# Patient Record
Sex: Male | Born: 1935
Health system: Southern US, Community
[De-identification: ages and names within clinical notes are randomized; demographics above are authoritative.]

## PROBLEM LIST (undated history)

## (undated) DIAGNOSIS — T4145XA Adverse effect of unspecified anesthetic, initial encounter: Secondary | ICD-10-CM

## (undated) DIAGNOSIS — G479 Sleep disorder, unspecified: Secondary | ICD-10-CM

## (undated) DIAGNOSIS — M1990 Unspecified osteoarthritis, unspecified site: Secondary | ICD-10-CM

## (undated) DIAGNOSIS — I10 Essential (primary) hypertension: Secondary | ICD-10-CM

## (undated) DIAGNOSIS — E785 Hyperlipidemia, unspecified: Secondary | ICD-10-CM

## (undated) DIAGNOSIS — K5790 Diverticulosis of intestine, part unspecified, without perforation or abscess without bleeding: Secondary | ICD-10-CM

## (undated) DIAGNOSIS — K219 Gastro-esophageal reflux disease without esophagitis: Secondary | ICD-10-CM

## (undated) DIAGNOSIS — K449 Diaphragmatic hernia without obstruction or gangrene: Secondary | ICD-10-CM

## (undated) DIAGNOSIS — T8859XA Other complications of anesthesia, initial encounter: Secondary | ICD-10-CM

## (undated) DIAGNOSIS — K2270 Barrett's esophagus without dysplasia: Secondary | ICD-10-CM

## (undated) DIAGNOSIS — N400 Enlarged prostate without lower urinary tract symptoms: Secondary | ICD-10-CM

## (undated) DIAGNOSIS — D649 Anemia, unspecified: Secondary | ICD-10-CM

## (undated) DIAGNOSIS — M459 Ankylosing spondylitis of unspecified sites in spine: Secondary | ICD-10-CM

## (undated) DIAGNOSIS — S0031XA Abrasion of nose, initial encounter: Secondary | ICD-10-CM

## (undated) HISTORY — PX: UMBILICAL HERNIA REPAIR: SHX196

## (undated) HISTORY — DX: Gastro-esophageal reflux disease without esophagitis: K21.9

## (undated) HISTORY — DX: Sleep disorder, unspecified: G47.9

## (undated) HISTORY — DX: Hyperlipidemia, unspecified: E78.5

## (undated) HISTORY — PX: JOINT REPLACEMENT: SHX530

## (undated) HISTORY — PX: OTHER SURGICAL HISTORY: SHX169

## (undated) HISTORY — PX: TOTAL HIP ARTHROPLASTY: SHX124

## (undated) HISTORY — DX: Benign prostatic hyperplasia without lower urinary tract symptoms: N40.0

## (undated) HISTORY — DX: Diaphragmatic hernia without obstruction or gangrene: K44.9

## (undated) HISTORY — DX: Diverticulosis of intestine, part unspecified, without perforation or abscess without bleeding: K57.90

## (undated) HISTORY — PX: TOE SURGERY: SHX1073

## (undated) HISTORY — DX: Anemia, unspecified: D64.9

## (undated) HISTORY — DX: Essential (primary) hypertension: I10

## (undated) HISTORY — DX: Barrett's esophagus without dysplasia: K22.70

## (undated) HISTORY — DX: Unspecified osteoarthritis, unspecified site: M19.90

---

## 1972-12-08 DIAGNOSIS — M1990 Unspecified osteoarthritis, unspecified site: Secondary | ICD-10-CM

## 1972-12-08 HISTORY — DX: Unspecified osteoarthritis, unspecified site: M19.90

## 1995-12-09 HISTORY — PX: INGUINAL HERNIA REPAIR: SUR1180

## 1998-05-30 ENCOUNTER — Emergency Department (HOSPITAL_COMMUNITY): Admission: EM | Admit: 1998-05-30 | Discharge: 1998-05-31 | Payer: Self-pay | Admitting: Internal Medicine

## 1998-08-02 ENCOUNTER — Inpatient Hospital Stay (HOSPITAL_COMMUNITY): Admission: RE | Admit: 1998-08-02 | Discharge: 1998-08-08 | Payer: Self-pay | Admitting: Orthopedic Surgery

## 1998-08-09 ENCOUNTER — Encounter (HOSPITAL_COMMUNITY): Admission: RE | Admit: 1998-08-09 | Discharge: 1998-11-07 | Payer: Self-pay | Admitting: Orthopedic Surgery

## 1999-01-15 ENCOUNTER — Encounter: Admission: RE | Admit: 1999-01-15 | Discharge: 1999-03-29 | Payer: Self-pay | Admitting: Rheumatology

## 1999-01-21 ENCOUNTER — Other Ambulatory Visit: Admission: RE | Admit: 1999-01-21 | Discharge: 1999-01-21 | Payer: Self-pay | Admitting: Gastroenterology

## 1999-10-09 ENCOUNTER — Encounter: Payer: Self-pay | Admitting: Orthopedic Surgery

## 1999-10-15 ENCOUNTER — Ambulatory Visit (HOSPITAL_COMMUNITY): Admission: RE | Admit: 1999-10-15 | Discharge: 1999-10-15 | Payer: Self-pay | Admitting: Orthopedic Surgery

## 2001-01-06 ENCOUNTER — Encounter (INDEPENDENT_AMBULATORY_CARE_PROVIDER_SITE_OTHER): Payer: Self-pay | Admitting: Specialist

## 2001-01-06 ENCOUNTER — Other Ambulatory Visit: Admission: RE | Admit: 2001-01-06 | Discharge: 2001-01-06 | Payer: Self-pay | Admitting: Gastroenterology

## 2001-07-12 ENCOUNTER — Other Ambulatory Visit: Admission: RE | Admit: 2001-07-12 | Discharge: 2001-07-12 | Payer: Self-pay | Admitting: Gastroenterology

## 2001-07-12 ENCOUNTER — Encounter (INDEPENDENT_AMBULATORY_CARE_PROVIDER_SITE_OTHER): Payer: Self-pay | Admitting: Specialist

## 2002-12-08 DIAGNOSIS — K2270 Barrett's esophagus without dysplasia: Secondary | ICD-10-CM

## 2002-12-08 HISTORY — DX: Barrett's esophagus without dysplasia: K22.70

## 2006-01-16 ENCOUNTER — Ambulatory Visit: Payer: Self-pay | Admitting: Gastroenterology

## 2006-02-18 ENCOUNTER — Ambulatory Visit: Payer: Self-pay | Admitting: Gastroenterology

## 2006-02-18 ENCOUNTER — Encounter (INDEPENDENT_AMBULATORY_CARE_PROVIDER_SITE_OTHER): Payer: Self-pay | Admitting: *Deleted

## 2006-10-01 ENCOUNTER — Ambulatory Visit (HOSPITAL_COMMUNITY): Admission: RE | Admit: 2006-10-01 | Discharge: 2006-10-01 | Payer: Self-pay | Admitting: General Surgery

## 2007-04-23 ENCOUNTER — Ambulatory Visit (HOSPITAL_COMMUNITY): Admission: RE | Admit: 2007-04-23 | Discharge: 2007-04-23 | Payer: Self-pay | Admitting: Orthopedic Surgery

## 2007-04-29 ENCOUNTER — Encounter: Admission: RE | Admit: 2007-04-29 | Discharge: 2007-06-18 | Payer: Self-pay | Admitting: Orthopedic Surgery

## 2008-07-03 ENCOUNTER — Observation Stay (HOSPITAL_COMMUNITY): Admission: EM | Admit: 2008-07-03 | Discharge: 2008-07-03 | Payer: Self-pay | Admitting: Emergency Medicine

## 2008-08-01 ENCOUNTER — Encounter: Admission: RE | Admit: 2008-08-01 | Discharge: 2008-08-24 | Payer: Self-pay | Admitting: Orthopedic Surgery

## 2008-10-10 ENCOUNTER — Observation Stay (HOSPITAL_COMMUNITY): Admission: EM | Admit: 2008-10-10 | Discharge: 2008-10-11 | Payer: Self-pay | Admitting: Emergency Medicine

## 2008-11-17 ENCOUNTER — Inpatient Hospital Stay (HOSPITAL_COMMUNITY): Admission: RE | Admit: 2008-11-17 | Discharge: 2008-11-20 | Payer: Self-pay | Admitting: Orthopedic Surgery

## 2008-12-13 ENCOUNTER — Encounter: Admission: RE | Admit: 2008-12-13 | Discharge: 2009-01-05 | Payer: Self-pay | Admitting: Orthopedic Surgery

## 2009-01-22 ENCOUNTER — Inpatient Hospital Stay (HOSPITAL_COMMUNITY): Admission: EM | Admit: 2009-01-22 | Discharge: 2009-01-27 | Payer: Self-pay | Admitting: Emergency Medicine

## 2009-01-31 DIAGNOSIS — D649 Anemia, unspecified: Secondary | ICD-10-CM

## 2009-01-31 DIAGNOSIS — M1990 Unspecified osteoarthritis, unspecified site: Secondary | ICD-10-CM | POA: Insufficient documentation

## 2009-01-31 DIAGNOSIS — F329 Major depressive disorder, single episode, unspecified: Secondary | ICD-10-CM

## 2009-01-31 DIAGNOSIS — K449 Diaphragmatic hernia without obstruction or gangrene: Secondary | ICD-10-CM | POA: Insufficient documentation

## 2009-01-31 DIAGNOSIS — M459 Ankylosing spondylitis of unspecified sites in spine: Secondary | ICD-10-CM | POA: Insufficient documentation

## 2009-01-31 DIAGNOSIS — I10 Essential (primary) hypertension: Secondary | ICD-10-CM

## 2009-01-31 DIAGNOSIS — K2270 Barrett's esophagus without dysplasia: Secondary | ICD-10-CM

## 2009-01-31 DIAGNOSIS — F3289 Other specified depressive episodes: Secondary | ICD-10-CM | POA: Insufficient documentation

## 2009-01-31 DIAGNOSIS — K5730 Diverticulosis of large intestine without perforation or abscess without bleeding: Secondary | ICD-10-CM | POA: Insufficient documentation

## 2009-01-31 DIAGNOSIS — K219 Gastro-esophageal reflux disease without esophagitis: Secondary | ICD-10-CM | POA: Insufficient documentation

## 2009-01-31 DIAGNOSIS — N400 Enlarged prostate without lower urinary tract symptoms: Secondary | ICD-10-CM | POA: Insufficient documentation

## 2009-02-06 ENCOUNTER — Ambulatory Visit: Payer: Self-pay | Admitting: Gastroenterology

## 2009-02-07 ENCOUNTER — Telehealth (INDEPENDENT_AMBULATORY_CARE_PROVIDER_SITE_OTHER): Payer: Self-pay | Admitting: *Deleted

## 2009-02-21 ENCOUNTER — Encounter: Admission: RE | Admit: 2009-02-21 | Discharge: 2009-05-22 | Payer: Self-pay | Admitting: Orthopedic Surgery

## 2009-03-06 ENCOUNTER — Ambulatory Visit: Payer: Self-pay | Admitting: Gastroenterology

## 2009-03-06 LAB — CONVERTED CEMR LAB
AST: 29 units/L (ref 0–37)
Albumin: 3.8 g/dL (ref 3.5–5.2)
Alkaline Phosphatase: 68 units/L (ref 39–117)
Basophils Absolute: 0.1 10*3/uL (ref 0.0–0.1)
Bilirubin, Direct: 0.2 mg/dL (ref 0.0–0.3)
Calcium: 9.2 mg/dL (ref 8.4–10.5)
Eosinophils Absolute: 0.3 10*3/uL (ref 0.0–0.7)
Ferritin: 27.5 ng/mL (ref 22.0–322.0)
Folate: >20 ng/mL
GFR calc non Af Amer: 87.86 mL/min (ref >60)
HCT: 42.9 % (ref 39.0–52.0)
Iron: 74 ug/dL (ref 42–165)
Lymphs Abs: 0.9 10*3/uL (ref 0.7–4.0)
MCV: 93.6 fL (ref 78.0–100.0)
Monocytes Absolute: 1.2 10*3/uL — ABNORMAL HIGH (ref 0.1–1.0)
Neutrophils Relative %: 64.9 % (ref 43.0–77.0)
Platelets: 246 10*3/uL (ref 150.0–400.0)
Potassium: 4.1 meq/L (ref 3.5–5.1)
RDW: 12.3 % (ref 11.5–14.6)
Saturation Ratios: 17.7 % — ABNORMAL LOW (ref 20.0–50.0)
Sodium: 141 meq/L (ref 135–145)
Total Bilirubin: 0.6 mg/dL (ref 0.3–1.2)
Transferrin: 298.2 mg/dL (ref 212.0–360.0)
WBC: 6.8 10*3/uL (ref 4.5–10.5)

## 2009-03-16 ENCOUNTER — Telehealth: Payer: Self-pay | Admitting: Gastroenterology

## 2009-03-19 ENCOUNTER — Ambulatory Visit: Payer: Self-pay | Admitting: Gastroenterology

## 2009-06-14 ENCOUNTER — Telehealth: Payer: Self-pay | Admitting: Gastroenterology

## 2009-06-19 ENCOUNTER — Telehealth: Payer: Self-pay | Admitting: Gastroenterology

## 2009-07-10 ENCOUNTER — Ambulatory Visit: Payer: Self-pay | Admitting: Gastroenterology

## 2009-07-25 ENCOUNTER — Ambulatory Visit: Payer: Self-pay | Admitting: Gastroenterology

## 2011-03-25 LAB — CBC
HCT: 43.4 % (ref 39.0–52.0)
Hemoglobin: 13 g/dL (ref 13.0–17.0)
Hemoglobin: 14.6 g/dL (ref 13.0–17.0)
MCHC: 33.5 g/dL (ref 30.0–36.0)
MCHC: 33.6 g/dL (ref 30.0–36.0)
MCHC: 34 g/dL (ref 30.0–36.0)
MCHC: 34.6 g/dL (ref 30.0–36.0)
MCV: 96.1 fL (ref 78.0–100.0)
MCV: 96.6 fL (ref 78.0–100.0)
Platelets: 180 10*3/uL (ref 150–400)
Platelets: 202 10*3/uL (ref 150–400)
RDW: 12.6 % (ref 11.5–15.5)
RDW: 12.9 % (ref 11.5–15.5)
WBC: 11.8 10*3/uL — ABNORMAL HIGH (ref 4.0–10.5)

## 2011-03-25 LAB — BASIC METABOLIC PANEL
BUN: 12 mg/dL (ref 6–23)
BUN: 12 mg/dL (ref 6–23)
CO2: 33 mEq/L — ABNORMAL HIGH (ref 19–32)
CO2: 33 mEq/L — ABNORMAL HIGH (ref 19–32)
Calcium: 8.3 mg/dL — ABNORMAL LOW (ref 8.4–10.5)
Chloride: 102 mEq/L (ref 96–112)
Chloride: 99 mEq/L (ref 96–112)
Creatinine, Ser: 0.95 mg/dL (ref 0.4–1.5)
Creatinine, Ser: 0.98 mg/dL (ref 0.4–1.5)
Glucose, Bld: 105 mg/dL — ABNORMAL HIGH (ref 70–99)
Glucose, Bld: 114 mg/dL — ABNORMAL HIGH (ref 70–99)
Potassium: 4.2 mEq/L (ref 3.5–5.1)
Sodium: 138 mEq/L (ref 135–145)

## 2011-03-25 LAB — PROTIME-INR
INR: 1.1 (ref 0.00–1.49)
INR: 2.5 — ABNORMAL HIGH (ref 0.00–1.49)
Prothrombin Time: 14.2 seconds (ref 11.6–15.2)
Prothrombin Time: 22.8 seconds — ABNORMAL HIGH (ref 11.6–15.2)
Prothrombin Time: 28.3 seconds — ABNORMAL HIGH (ref 11.6–15.2)

## 2011-03-25 LAB — TYPE AND SCREEN: Antibody Screen: NEGATIVE

## 2011-04-22 NOTE — Op Note (Signed)
NAMETAHA, DIMOND                  ACCOUNT NO.:  [id number]   MEDICAL RECORD NO.:  [id number]          PATIENT TYPE:  INP   LOCATION:  5011                         FACILITY:  [facility]   PHYSICIAN:  Ollen Gross, M.D.    DATE OF BIRTH:  1934-05-12   DATE OF PROCEDURE:  11/17/2008  DATE OF DISCHARGE:                               OPERATIVE REPORT   PREOPERATIVE DIAGNOSIS:  Unstable left total hip arthroplasty.   POSTOPERATIVE DIAGNOSIS:  Unstable left total hip arthroplasty.   PROCEDURE:  Left acetabular revision.   SURGEON:  Ollen Gross, MD   ASSISTANT:  Alexzandrew L. Julien Girt, PA-C   ANESTHESIA:  General.   ESTIMATED BLOOD LOSS:  100.   DRAIN:  None.   COMPLICATIONS:  None.   CONDITION:  Stable to recovery.   BRIEF CLINICAL NOTE:  Mr. Selner is a 77 year old male, had a left hip  arthroplasty revision performed approximately 10 years ago.  Did well  until this past year when he has had 3 dislocations.  He had no  instability problems prior to that.  He presents now for acetabular  versus total hip revision.   PROCEDURE IN DETAIL:  After successful administration of general  anesthetic, the patient was placed in right lateral decubitus position  with the left side up and held with the hip positioner.  Left lower  extremity was isolated from his perineum with plastic drapes, and  prepped and draped in the usual sterile fashion.  Previous  posterolateral incisions were utilized.  Skin cut with a #10 blade  through subcutaneous tissue to the level of fascia lata, which incised  in line with the skin incision.  The posterior pseudocapsule had been  torn off of the femur with his previous dislocations.  There was a small  amount of lytic debris, which was excised.  The acetabular component  appeared to be in very good position.  I placed through a range of  motion and dislocated at 70 degrees of flexion, 40 degrees of abduction,  and about 30 degrees of internal rotation.  I  removed the femoral head.  The femoral component was in very good position with about 25 degrees of  anteversion.  It is a well-fixed S-ROM component.  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  The trochanteric fragment was rather flimsy  and was really not amenable to reattachment.  It does remain attached  via soft tissue to the main body of the femur, but there was not enough  of bone there to consider reattaching of bone to bone.  The femur was  then retracted anteriorly to gain acetabular exposure.  Scar tissue was  removed from around the acetabular component to show the entire  component.  I then removed the dome screws from the S-ROM acetabular  shell.  The posterior dome screw was easily removed, the anterior one  was lodged inferiorly well, so we just removed the screw head, as we  could not get the screw out.  The 2 dome screws were then removed also  after we got the acetabular  liner out.  The liner had a large groove in  it posteriorly from where it had worn down significantly, and I believe  that groove was a tract that allowed this to dislocate.  The component  was thoroughly inspected and was very well fixed.  It was also in  anatomic position.  I felt that the best method of managing this would  be to up size into a 36-mm head, with a 36-mm liner as apposed to his  previous 28 on 28.  The particular S-ROM shell does not have adequate  polyethylene thickness for a 36.  I put a trial Pinnacle liner 36  neutral +4 and it seated well into the component and would allow for 2-3  mm of cement penetration.  The acetabular shell had holes in it, which  would allow for cement penetration.  I also put grooves into the shell,  so the cement would penetrate into those.  We then opened up the 36 mm  neutral +4 Marathon liner and mixed the cement.  Once it was ready for  implantation, the cement was placed into the acetabular shell and a  liner was impacted into the shell and  held in position until cement  hardens.  This also gave [place] a little more offset than his previous  polyethylene head.  Once it was fully hardened, then I placed a trial  36, +0 head and I felt that it did not have enough of soft tissue  tension, so I went to a 36, +3 which had appropriate soft tissue  tension.  The stability was great with full extension, full external  rotation, 70 degrees of flexion, 40 degrees of adduction, and 90 degrees  of internal rotation and 90 degrees of flexion and 70 degrees of  internal rotation.  By placing the left leg on top of the right, I felt  as though lengths were equal.  When I was placed in through a range of  motion, the acetabular liner remained stable throughout and there was  absolutely no motion between the liner and the shell.  We then  dislocated the hip and placed the permanent 36 mm +3 femoral head for  the S-ROM femur.  We placed through a range of motion, still had the  same stability.  Wounds again irrigated, and the soft tissue closed  posteriorly with #1 Ethibond.  Fascia lata was closed with interrupted  #1 Vicryl, subcu closed with #1 and 2-0 Vicryl, and skin with staples.  Incisions cleaned and dried, and a bulky sterile dressing applied.  He  was then placed into a knee immobilizer, awakened, and transported to  recovery in stable condition.      Ollen Gross, M.D.  Electronically Signed     FA/MEDQ  D:  11/17/2008  T:  11/17/2008  Job:  [id number]

## 2011-04-22 NOTE — Discharge Summary (Signed)
NAMEYANKY, VANDERBURG                  ACCOUNT NO.:  [id number]   MEDICAL RECORD NO.:  [id number]          PATIENT TYPE:  INP   LOCATION:  1611                         [facility]ACILITY:  [facility]   PHYSICIAN:  Ollen Gross, M.D.    DATE OF BIRTH:  [birth year]   DATE OF ADMISSION:  01/22/2009  DATE OF DISCHARGE:  01/27/2009                               DISCHARGE SUMMARY   DISCHARGE DIAGNOSIS:  Unstable left total hip arthroplasty.   OTHER DIAGNOSES:  1. Hypertension.  2. Arthritis.  3. Prostate hypertrophy.  4. Reflux.   PROCEDURES:  1. Attempted closed reduction of dislocated hip on January 22, 2009.  2. Left acetabular liner revision to constrained liner on January 24, 2009.   HOSPITAL COURSE:  Mr. [name] is a 77 year old male who was taken to the  emergency room on January 22, 2009, having dislocated his left total  hip.  He had a history of hip instability and had a procedure done in  December 2009, at which point he had his acetabular liner revised and  his femoral head upsized to a 236 mm head.  He had done very well up  until the date of January 22, 2009, when the hip popped out on him.  He  was evaluated in the emergency room by Dr. Lestine Box and taken to the  operating room, at which point closed reduction was attempted.  The hip  was not able to be reduced.  I also accompanied Dr. Lestine Box in the  operating room and could not get the hip reduced.  The patient was  subsequently admitted to the hospital, and it was decided that he would  need a revision to a constrained system.  This had to be ordered, and  thus, he was not taken to surgery until January 24, 2009.  He was  comfortable on January 22, 2009, and January 23, 2009, in the  hospital.  He was on bedrest.  He had stable labs with a hemoglobin of  14.7, BMET with a sodium of 135, potassium of 3.9, chloride 102, CO2 of  29, BUN 12, creatinine 0.95 and glucose 138.  He was taken to the  operating room on January 24, 2009, at which time he had a constrained  liner placed.  This was a constrained liner for the SROM acetabular  shell.  It was a 32 mm, 0 degree L series.  He tolerated this procedure  well without complication.  Procedures done by Dr. Lequita Halt and assisted  Dr. Avel Peace, PA-C.  Estimated blood loss was 100.  He was taken to  the recovery room postoperatively and then to the orthopedic floor in  stable condition.  He had intact neurovascular function of the left  lower extremity.  Postop day 1, hemoglobin was 14.6 with normal  electrolytes.  He was weightbearing as tolerated on the left lower  extremity.  Postop day 2, he ambulated with physical therapy.  Hemoglobin was this 13.9.  His PCA, IV fluids, IV and Foley were all  discontinued at that time.  He was  discharged home on January 27, 2009,  tolerating a regular diet in stable condition.  Hemoglobin was 13.  He  was able to weight bear as tolerated and ambulate well with physical  therapy.   MEDICATIONS:  He was discharged home on the following medications:  1. Vicodin 1-2 tablets q.4-6 h. as needed for pain.  2. Robaxin 500 mg 1 tablet q.6-8 h. as needed for muscle spasm.  3. Coumadin as per pharmacy protocol.  4. Keflex 500 mg 4 times a day due to swelling in his hand from an IV      site.   OTHER MEDICATIONS AT HOME:  Include the following:  1. Hydrochlorothiazide 25 mg daily.  2. Nexium 40 mg daily.  3. Lopressor 25 mg b.i.d.  4. Flomax 0.4 mg at bedtime.  5. Crestor 10 mg p.o. q.p.m.   DISPOSITION:  The patient was sent home weightbearing as tolerated, left  lower extremity.  He is home with home health PT and home health  nursing.  Follow up in a 1-1/2 weeks at Dr. Deri Fuelling office for removal  of staples.  The patient was in good condition at time of discharge.      Ollen Gross, M.D.  Electronically Signed     FA/MEDQ  D:  03/03/2009  T:  03/03/2009  Job:  [id number]

## 2011-04-22 NOTE — Op Note (Signed)
NAMEBURWELL, BETHEL                  ACCOUNT NO.:  [id number]   MEDICAL RECORD NO.:  [id number]          PATIENT TYPE:  OBV   LOCATION:  1611                         [facility]ACILITY:  [facility]   PHYSICIAN:  Leonides Grills, M.D.     DATE OF BIRTH:  20-Aug-1934   DATE OF PROCEDURE:  01/22/2009  DATE OF DISCHARGE:                               OPERATIVE REPORT   PREOPERATIVE DIAGNOSIS:  Left dislocated total hip arthroplasty.   POSTOPERATIVE DIAGNOSIS:  Left dislocated total hip arthroplasty.   OPERATION:  Closed reduction left dislocated total hip arthroplasty.   ANESTHESIA:  General.   SURGEONS:  1. Leonides Grills, M.D.  2. Ollen Gross, M.D.   ASSISTANT:  Richardean Canal, P.A.   ESTIMATED BLOOD LOSS:  None.   Intraoperative x-ray showed a non-concentric reduction, possible loose  poly versus soft tissue interposition.   DISPOSITION:  Stable to PR.   INDICATIONS:  This is a 77 year old male who underwent a revision on  November 17, 2008, by Dr. Lequita Halt of his left total hip arthroplasty.  He was doing great postoperatively until today he was trying to don a  sock and by his own admission was a little aggressive and dislocated  his left hip.  On x-ray, he had a superior dislocation.  He did not  appear to have a fracture.  He consented for the above procedure.  All  risks, including infection, nerve or vessel injury, possibility that we  would not be able to reduce this iatrogenic fracture and possibility of  future revision were all explained and questions were encouraged and  answered.   DESCRIPTION OF PROCEDURE:  The patient was brought to the operating and  placed in supine position after adequate general anesthesia was  administered.  I then attempted a closed reduction of the hip.  I tried  90/90, and slightly abducted, as well as externally rotated and I could  not get it reduced.  Dr. Lequita Halt tried after myself and between him a  myself, we were able to get a somewhat reduced hip.   It seemed that the  leg lengths were of adequate length and he was now externally rotated.  We got the intraoperative x-ray that showed that he had a non-concentric  reduction and it appeared that the head was over the superior rim of the  acetabulum, but essentially was not concentric.   IMPRESSION:  He was stable to the OR in a knee immobilizer.  After  discussing this with Dr. Lequita Halt, we admitted the patient to Dr.  Lequita Halt, and he is going to do a possible revision versus open reduction  of the left total hip arthroplasty.  We both discussed this with the  family after surgery.  All questions were encouraged and answered.      Leonides Grills, M.D.  Electronically Signed     PB/MEDQ  D:  01/22/2009  T:  01/22/2009  Job:  [id number]

## 2011-04-22 NOTE — Op Note (Signed)
[name], [name]                  ACCOUNT NO.:  [id number]   MEDICAL RECORD NO.:  [id number]          PATIENT TYPE:  INP   LOCATION:  1604                         [facility]ACILITY:  [facility]   PHYSICIAN:  Almedia Balls. Ranell Patrick, M.D. DATE OF BIRTH:  10/02/1934   DATE OF PROCEDURE:  10/10/2008  DATE OF DISCHARGE:  10/11/2008                               OPERATIVE REPORT   PREOPERATIVE DIAGNOSIS:  Left total hip dislocation.   POSTOPERATIVE DIAGNOSIS:   PROCEDURE PERFORMED:  Closed reduction of left total hip replacement.   SURGEON:  Almedia Balls. Ranell Patrick, M.D.   ASSISTANT:  None.   ANESTHESIA:  General anesthesia was used.   FLUID REPLACEMENT:  200 mL crystalloid.   INDICATIONS:  The patient is a 77 year old male who has had a total hip  replacement done by Dr. Lequita Halt.  The patient has had multiple episodes  of instability in the last 2 months.  Today is the third dislocation of  his left total hip.  The patient presents now with a clinically  dislocated total hip with dislocated hip on x-ray with no signs of  fracture.  The patient presents for closed reduction.  Informed consent  obtained.   DESCRIPTION OF PROCEDURE:  After an adequate level of general anesthesia  was achieved, the patient had a flexion internal procedure performed  with counter pressure on the anterior superior iliac spine.  We were  able to successfully close reduce the hip regaining leg length and  having smooth motion in the hip after the reduction clinically.  We then  placed the patient in an abduction orthosis and x-rays demonstrated  concentric hip reduction.  No fractures were noted.  The patient was  taken to the recovery room in stable condition.      Almedia Balls. Ranell Patrick, M.D.  Electronically Signed     SRN/MEDQ  D:  10/10/2008  T:  10/11/2008  Job:  [id number]   cc:   Ollen Gross, M.D.  Fax: [phone]

## 2011-04-22 NOTE — Discharge Summary (Signed)
[name], [name]                  ACCOUNT NO.:  [id number]   MEDICAL RECORD NO.:  [id number]          PATIENT TYPE:  INP   LOCATION:  5011                         FACILITY:  [facility]   PHYSICIAN:  Ollen Gross, M.D.    DATE OF BIRTH:  [birth year]   DATE OF ADMISSION:  11/17/2008  DATE OF DISCHARGE:  11/20/2008                               DISCHARGE SUMMARY   ADMITTING DIAGNOSES:  1. Unstable left total hip.  2. Hypertension.  3. Hypercholesterolemia.  4. Reflux disease.  5. Barrett esophagus.   DISCHARGE DIAGNOSES:  1. Unstable left total hip arthroplasty status post left acetabular      revision.  2. Hypertension.  3. Hypercholesterolemia.  4. Reflux disease.  5. Barrett esophagus.   PROCEDURE:  November 17, 2008, left acetabular revision.   SURGEON:  Ollen Gross, MD   ASSISTANT:  Alexzandrew L. Julien Girt, [facility]   ANESTHESIA:  General.   CONSULTS:  None.   BRIEF HISTORY:  [name] is a 77 year old male with a left hip  arthroplasty revision performed approximately 10 years ago, did well,  recent set 3 dislocations felt to be an instability problems admitted  for revision.   LABORATORY DATA:  Preop CBC; hemoglobin 16.9, hematocrit 50.8, white  cell count 9.5, platelets 252.  Postop hemoglobin 15.3.  Last H&H of  14.9 and 43.3.  PT/PTT on admission 13.4 and 34 respectively, INR 1.0.  Serial protimes followed per Coumadin protocol.  Last PT/INR 28.7 and  2.5.  Chem panel on admission all within normal limits except with  elevated glucose of 151.  Serial BMETS were followed.  Electrolytes  remained within normal limits.  Preop UA negative.  Blood group type O+.   HOSPITAL COURSE:  The patient was admitted to [facility], taken  to the OR, underwent above said procedure without complication.  The  patient tolerated the procedure well, later transferred to recovery room  in Orthopedic floor, started on PCA and p.o. analgesics, doing pretty  well.  On the morning  of day 1, discontinued the PCA and weaned over  p.o. meds, started getting up with therapy, partial weightbearing.  Actually did very well on the morning of day 1 with therapy, walked  about feet 70 feet, tolerating meds.  By day 2, the patient continued to  progress well, walking about 120 feet, dressing were changed and  incision looked good.  No signs of infection.  Arrangements are being  made for a discharge.  I gave Dulcolax for constipation.  By day 3, he  had a little bit of temperature on the evening of day 2 and on the  morning of day 3 of 101.4, but temperature was back down to 98.2,  recommended incentive spirometer.  Hemoglobin looked good, tolerating  therapy.  Arrangements were made for home care and the patient was  discharged to home.   DISCHARGE PLAN:  1. The patient was discharged to home on November 20, 2008.  2. Discharge diagnoses, please see above.  3. Discharge meds:  Vicodin, Robaxin, and Coumadin.   ACTIVITY:  Partial weightbearing,  hip precautions, total protocol.   FOLLOWUP:  In 2 weeks.  Call for appointment.   DISPOSITION:  Home.   CONDITION ON DISCHARGE:  Improved.      Alexzandrew L. Perkins, P.A.C.      Ollen Gross, M.D.  Electronically Signed    ALP/MEDQ  D:  01/16/2009  T:  01/17/2009  Job:  [id number]   cc:   Ollen Gross, M.D.  Juluis Rainier, M.D.

## 2011-04-22 NOTE — H&P (Signed)
[name], [name]                  ACCOUNT NO.:  [id number]   MEDICAL RECORD NO.:  [id number]          PATIENT TYPE:  INP   LOCATION:                               FACILITY:  [facility]   PHYSICIAN:  Ollen Gross, M.D.    DATE OF BIRTH:  Dec 12, 1933   DATE OF ADMISSION:  11/17/2008  DATE OF DISCHARGE:                              HISTORY & PHYSICAL   CHIEF COMPLAINT:  Unstable left total hip arthroplasty.   HISTORY OF PRESENT ILLNESS:  The patient is a 77 year old male with a  history of the left total hip arthroplasty placed initially in 1984 with  a revision in 1999.  The patient has had 3 subsequent dislocations, last  being reduced by Dr. Ranell Patrick at [facility] on October 10, 2008.  Dr. Lequita Halt and the patient have reviewed x-rays and the patient's  current issues, and the patient and Dr. Lequita Halt would like to proceed  with a revision of his left total hip arthroplasty.   ALLERGIES:  No known drug allergies.   MEDICATIONS:  1. Nexium 40 mg a day.  2. Hydrochlorothiazide 25 mg a day.  3. Metoprolol 45 mg b.i.d.  4. Indomethacin 25 mg b.i.d. p.r.n.  5. Crestor 10 mg a day.  6. Flomax for 0.4 mg a day.  7. Centrum Silver.  8. Glucosamine chondroitin.  9. Cinnamon.  10.Prostate health.  11.Saw palmetto.  12.Pumpkin seed oil.  13.Hyaluronic acid.  14.Stearic acid.  15.Folic acid.  16.Linoleic acid.  17.Lycopene.  18.Omega 3, 6, and 9.  19.Super B complex.   PRIMARY CARE PHYSICIAN:  Dr. Juluis Rainier.   PAST MEDICAL HISTORY:  1. Includes hypertension.  2. Hypercholesterolemia.  3. Reflux disease.  4. Barrett's esophagitis.   REVIEW OF SYSTEMS:  Negative for any neurologic issues.  No pulmonary  issues.  No cardiovascular issues.  He denies any GI or GU problems.  No  endocrine.  He did have blood transfusion with his initial hip  replacement.   PAST SURGICAL HISTORY:  Includes left hip surgery in 1984 with revision  in 1999 with hiccups being the only  complications.   FAMILY MEDICAL HISTORY:  Father is deceased at the age of 69 from age-  related issues.  Mother is deceased at 86 from age issues.  The patient  is widowed.  He is a Landscape architect.  He has smoked in the past, stopped in  1999.  He denies any use alcohol or drugs.  Lives in a one-story house 3  steps into it.  He does have close family support.   PHYSICAL EXAM:  VITALS:  Height is 6 feet, weight is 220 pounds, blood  pressure is 118/68, pulse of 70 and regular, respirations are 12  nonlabored, patient is afebrile.  GENERAL:  This is a healthy-appearing, well-developed gentleman  conscious, alert and appropriate wearing a left-sided abduction brace.  HEENT:  Head was normocephalic.  Pupils equal, round and reactive.  Extraocular muscles intact.  NECK:  Supple.  He had a difficult time touching his chin to his chest.  He was not able  to raise his head above the neutral position.  He was  only able to rotate his neck right and left about 30 degrees due to  tightness in his neck.  He had no palpable lymphadenopathy.  CHEST:  Lungs were clear and equal bilaterally.  HEART:  Regular rate and rhythm.  ABDOMEN:  Soft.  Bowel sounds present.  UPPER EXTREMITIES:  Had good range of motion of shoulders, elbows and  wrists.  Motor strength 5/5.  LOWER EXTREMITIES:  Right with hip he was able to fully extended it.  He  can flex it up to 120 degrees with normal internal-external rotation.  Left hip was in an abduction brace and was not examined.  He was able  straight leg both legs.  Was able to flex them back to 120 degrees with  no instability.  Calves were soft, good range of motion of the ankles.  PERIPHERAL VASCULAR:  Carotid pulses were 2+ no bruits.  Radial pulses  2+.  Posterior tibial pulses 1+.  He had no lower extremity edema or  venous stasis changes.  Breast, rectal and GU exams were deferred at this time.   IMPRESSION:  1. Unstable left total hip arthroplasty.  2.  Hypertension.  3. Hypercholesterolemia.  4. Reflux disease.  5. Barrett's esophagitis.   PLAN:  The patient will undergo all routine labs and tests prior to  having revision of his left total hip arthroplasty by Dr. Lequita Halt at  [facility] on November 17, 2008.  Due to the patient's loss of  range of motion of his neck, will have him evaluated preoperatively by  anesthesia.      Jamelle Rushing, P.A.      Ollen Gross, M.D.  Electronically Signed    RWK/MEDQ  D:  11/06/2008  T:  11/06/2008  Job:  [id number]

## 2011-04-22 NOTE — Op Note (Signed)
[name], [name]                  ACCOUNT NO.:  [id number]   MEDICAL RECORD NO.:  [id number]          PATIENT TYPE:  INP   LOCATION:  1611                         FACILITY:  [facility]   PHYSICIAN:  Ollen Gross, M.D.    DATE OF BIRTH:  1934-11-23   DATE OF PROCEDURE:  01/24/2009  DATE OF DISCHARGE:                               OPERATIVE REPORT   PREOPERATIVE DIAGNOSIS:  Unstable left total hip arthroplasty.   POSTOPERATIVE DIAGNOSIS:  Unstable left total hip arthroplasty.   PROCEDURE:  Left acetabular revision.   SURGEON:  Dr. Lequita Halt.   ASSISTANT:  Avel Peace, PA-C.   ANESTHESIA:  General.   ESTIMATED BLOOD LOSS:  100.   DRAIN:  None.   COMPLICATIONS:  None.   CONDITION:  Stable to recovery.   BRIEF CLINICAL NOTE:  [name] is a 77 year old male who sustained a  dislocation to his left hip 2 days ago.  An attempt at closed reduction  was performed, but could not be maintained.  Of note, he had a revision  over 10 years ago and good longevity up until last year when he started  have dislocations.  He had a liner revision in December 2009, cementing  in a liner with a 36 mm diameter into the acetabular shell.  He did well  until 2 days ago when he sustained a dislocation.  Concern was that  either there was something in the liner preventing reduction or that the  liner had spun out.  He presents now for open reduction versus revision.   PROCEDURE IN DETAIL:  After the successful initiation of general  anesthetic, the patient was placed in the right lateral decubitus  position with the left side up and held with the hip positioner.  The  left lower extremity was isolated from his perineum with plastic drapes  and prepped and draped in the usual sterile fashion.  Previous  posterolateral incisions were utilized.  The skin cut with a 10 blade to  the fascia lata which was incised in line with the skin incision.  We  identified some hematoma.  This was irrigated out and  removed.  We noted  that the acetabular liner had ripped out of the shell.  This accounted  for the instability.  I removed the femoral head from the femoral  component and retracted the femur anteriorly to get exposure to the  acetabulum.  We then removed the cement from the acetabular shell and  there was no damage to the shell.  The shell was in good position.  This  was an L series S-ROM acetabular shell.  I then placed the L series  constrained liner into the shell, locked it in position and placed two  peripheral screws to prevent rotation.  We then put a 32+ 3 femoral head  onto the femoral neck, reduced this and then placed the locking ring and  impacted the locking ring.  He was stable throughout full motion with  full extension, flexion and rotation with about 70 degrees of flexion,  20 degrees of internal rotation, 90 degrees  of flexion and 20 degrees  internal rotation.  There was no impingement noted.  We then thoroughly  irrigated and closed  the fascia with interrupted #1 Vicryl, subcu with #1-0 and #2-0 Vicryl  and skin with staples.  The incision was cleaned and dried and a bulky  sterile dressing applied.  He was placed in a knee immobilizer, awakened  and transported to recovery in stable condition.      Ollen Gross, M.D.  Electronically Signed     FA/MEDQ  D:  01/24/2009  T:  01/24/2009  Job:  [id number]

## 2011-04-22 NOTE — H&P (Signed)
Jordan, Fuentes                  ACCOUNT NO.:  [id number]   MEDICAL RECORD NO.:  [id number]          PATIENT TYPE:  OBV   LOCATION:  0098                         FACILITY:  [facility]   PHYSICIAN:  Leonides Grills, M.D.     DATE OF BIRTH:  03-01-1934   DATE OF ADMISSION:  01/22/2009  DATE OF DISCHARGE:                              HISTORY & PHYSICAL   CHIEF COMPLAINT:  Left hip pain.   HISTORY OF PRESENT ILLNESS:  [name] is a pleasant 77 year old white  male well known to Dr. Lequita Halt.  Patient reports he was putting on his  socks this a.m. when he dislocated his left hip.  Patient denies any  chest pain, shortness of breath, loss of consciousness or other injury.  Patient with a history. Of left total hip arthroplasty with a revision  approximately 10 years ago.  Then patient had 3 dislocations in 2009  resulting in a revision left acetabulum by Dr. Lequita Halt in December 2009.  Patient's last meal last night January 21, 2009.   ALLERGIES:  CODEINE causes hypotension.   MEDICATIONS:  1. Hydrochlorothiazide 25 mg 1 p.o. daily.  2. Nexium 40 mg 1 p.o. daily.  3. Toprol tartrate 25 mg 1 p.o. b.i.d.  4. Indomethacin 25 mg 1 p.o. b.i.d. p.r.n.  5. Flomax 0.4 mg 1 p.o. q.h.s.  6. Crestor 10 mg 1 p.o. daily.   PAST MEDICAL HISTORY:  1. Hypertension.  2. Hypercholesterolemia.  3. Reflux disease.  4. Barrett's esophagus.   PAST SURGICAL HISTORY:  1. Primary left total hip 1984.  2. Revision 1999.  3. Left total hip acetabular revision December 2009.   REVIEW OF SYSTEMS:  Denies any chest pain, shortness of breath, nausea,  vomiting, cardiac disease, diabetes mellitus, respiratory issues.  Positive for hypertension, hypercholesterolemia, gastric reflux,  Barrett's esophagitis.   PHYSICAL EXAMINATION:  Blood pressure 159/68, pulse 76, respiratory rate  20, O2 saturation 94%.  GENERAL:  Patient is awake and alert, oriented x3.  LOWER EXTREMITIES:  Left lower leg shortened and  externally rotated  left.  Dorsal pedal pulse 2+.  Positive sensation to light touch  throughout the foot.  Left EHL, FHL intact.  Left lower leg nontender.  Right lower leg had no gross abnormalities.   RADIOGRAPHS:  Left hip 2-view shows posterior and superior dislocation  of left femur, no acute fracture.   ASSESSMENT AND PLAN:  Patient is a 77 year old white male with a history  of hypertension, hypercholesterolemia, gastric reflux, Barrett's  esophagitis and left total hip requiring 2 revisions, now with a left  total hip dislocation.   1. Closed reduction of left total hip by Dr. Lestine Box today in the OR.  2. Follow up with Dr. Lequita Halt next week in the office.  Patient to      call; it is [phone] for appointment.  3. Patient is to be placed in a knee immobilizer postoperatively to      prevent future dislocation.      Richardean Canal, P.A.      Leonides Grills, M.D.  Electronically Signed  GC/MEDQ  D:  01/22/2009  T:  01/22/2009  Job:  [id number]   cc:   Leonides Grills, M.D.  Fax: [phone]

## 2011-04-22 NOTE — Op Note (Signed)
[name], [name]                  ACCOUNT NO.:  [id number]   MEDICAL RECORD NO.:  [id number]          PATIENT TYPE:  INP   LOCATION:  0098                         FACILITY:  [facility]   PHYSICIAN:  Ollen Gross, M.D.    DATE OF BIRTH:  Apr 22, 1934   DATE OF PROCEDURE:  07/03/2008  DATE OF DISCHARGE:                               OPERATIVE REPORT   PREOPERATIVE DIAGNOSIS:  Dislocated left total hip arthroplasty.   POSTOPERATIVE DIAGNOSIS:  Dislocated left total hip arthroplasty.   PROCEDURE:  Closed reduction, left hip dislocation.   SURGEON:  Ollen Gross, M.D.   ASSISTANT:  Avel Peace PA-C   ANESTHESIA:  General.   COMPLICATIONS:  None.   CLINICAL NOTE:  [name] is a 77 year old male who had a left total hip  arthroplasty revision done 10 years ago.  He was doing fine and this  morning upon getting off the commode, felt his hip pop.  He was unable  to bear weight.  He sustained an anterior dislocation.  He presents for  closed reduction.   PROCEDURE IN DETAIL:  At the successful administration of general  anesthetic, I attempted to reduce the hip with my assistant applying  counter traction.  With the counter traction and longitudinal traction  and extension on my behalf and then some internal rotation, I felt the  hip pop back into the joint.  I was able to flex him 100, rotate about  20 in each direction without difficulty and with full extension, full  external rotation, he remained reduced.  AP radiograph was taken showing  concentric reduction of the hip.  He was subsequently awakened and  transported to recovery in stable condition.      Ollen Gross, M.D.  Electronically Signed     FA/MEDQ  D:  07/03/2008  T:  07/04/2008  Job:  [id number]

## 2011-09-05 LAB — BASIC METABOLIC PANEL
Chloride: 105
Creatinine, Ser: 0.97
GFR calc Af Amer: >60
Potassium: 4.6
Sodium: 143

## 2011-09-05 LAB — HEMOGLOBIN AND HEMATOCRIT, BLOOD
HCT: 45.2
Hemoglobin: 15.5

## 2011-09-09 LAB — CBC
HCT: 49
Hemoglobin: 16.3
MCHC: 33.3
MCV: 98.3
Platelets: 240
RBC: 4.98
RDW: 12.5
WBC: 12.1 — ABNORMAL HIGH

## 2011-09-09 LAB — POCT I-STAT, CHEM 8
BUN: 20
Calcium, Ion: 1.2
Chloride: 99
Creatinine, Ser: 1.1
Glucose, Bld: 116 — ABNORMAL HIGH
HCT: 50
Hemoglobin: 17
Potassium: 3.7
Sodium: 142
TCO2: 30

## 2011-09-09 LAB — DIFFERENTIAL
Basophils Absolute: 0
Basophils Relative: 0
Eosinophils Absolute: 0.3
Eosinophils Relative: 2
Lymphocytes Relative: 12
Lymphs Abs: 1.4
Monocytes Absolute: 1
Monocytes Relative: 8
Neutro Abs: 9.4 — ABNORMAL HIGH
Neutrophils Relative %: 78 — ABNORMAL HIGH

## 2011-09-12 LAB — URINALYSIS, ROUTINE W REFLEX MICROSCOPIC
Hgb urine dipstick: NEGATIVE
Specific Gravity, Urine: 1.026 (ref 1.005–1.030)
Urobilinogen, UA: 0.2 mg/dL (ref 0.0–1.0)

## 2011-09-12 LAB — CBC
MCHC: 33.9 g/dL (ref 30.0–36.0)
MCV: 96.2 fL (ref 78.0–100.0)
MCV: 96.7 fL (ref 78.0–100.0)
Platelets: 220 10*3/uL (ref 150–400)
Platelets: 221 10*3/uL (ref 150–400)
Platelets: 227 10*3/uL (ref 150–400)
Platelets: 252 10*3/uL (ref 150–400)
RBC: 4.43 MIL/uL (ref 4.22–5.81)
RBC: 4.58 MIL/uL (ref 4.22–5.81)
RDW: 12.5 % (ref 11.5–15.5)
RDW: 12.7 % (ref 11.5–15.5)
RDW: 12.7 % (ref 11.5–15.5)
WBC: 10.7 10*3/uL — ABNORMAL HIGH (ref 4.0–10.5)
WBC: 12.7 10*3/uL — ABNORMAL HIGH (ref 4.0–10.5)

## 2011-09-12 LAB — PROTIME-INR
INR: 1 (ref 0.00–1.49)
INR: 1.2 (ref 0.00–1.49)
INR: 2.5 — ABNORMAL HIGH (ref 0.00–1.49)
Prothrombin Time: 15.8 seconds — ABNORMAL HIGH (ref 11.6–15.2)
Prothrombin Time: 22.9 seconds — ABNORMAL HIGH (ref 11.6–15.2)
Prothrombin Time: 28.7 seconds — ABNORMAL HIGH (ref 11.6–15.2)

## 2011-09-12 LAB — BASIC METABOLIC PANEL
BUN: 10 mg/dL (ref 6–23)
BUN: 12 mg/dL (ref 6–23)
CO2: 31 mEq/L (ref 19–32)
Calcium: 8.4 mg/dL (ref 8.4–10.5)
Calcium: 9 mg/dL (ref 8.4–10.5)
Creatinine, Ser: 1 mg/dL (ref 0.4–1.5)
Creatinine, Ser: 1.09 mg/dL (ref 0.4–1.5)
GFR calc Af Amer: >60 mL/min (ref >60)
GFR calc non Af Amer: >60 mL/min (ref >60)
Glucose, Bld: 121 mg/dL — ABNORMAL HIGH (ref 70–99)
Glucose, Bld: 136 mg/dL — ABNORMAL HIGH (ref 70–99)

## 2011-09-12 LAB — TYPE AND SCREEN
ABO/RH(D): O POS
Antibody Screen: NEGATIVE

## 2011-09-12 LAB — COMPREHENSIVE METABOLIC PANEL
ALT: 35 U/L (ref 0–53)
Albumin: 4.1 g/dL (ref 3.5–5.2)
Alkaline Phosphatase: 59 U/L (ref 39–117)
Potassium: 4.3 mEq/L (ref 3.5–5.1)
Sodium: 142 mEq/L (ref 135–145)
Total Protein: 7.3 g/dL (ref 6.0–8.3)

## 2012-02-27 ENCOUNTER — Other Ambulatory Visit: Payer: Self-pay

## 2012-02-27 DIAGNOSIS — M79609 Pain in unspecified limb: Secondary | ICD-10-CM

## 2012-02-27 DIAGNOSIS — R238 Other skin changes: Secondary | ICD-10-CM

## 2012-03-23 ENCOUNTER — Encounter: Payer: Self-pay | Admitting: Vascular Surgery

## 2012-04-07 ENCOUNTER — Encounter: Payer: Self-pay | Admitting: Vascular Surgery

## 2012-05-04 ENCOUNTER — Encounter: Payer: Self-pay | Admitting: Vascular Surgery

## 2012-05-05 ENCOUNTER — Ambulatory Visit (INDEPENDENT_AMBULATORY_CARE_PROVIDER_SITE_OTHER): Payer: Medicare Other | Admitting: Vascular Surgery

## 2012-05-05 ENCOUNTER — Encounter: Payer: Self-pay | Admitting: Vascular Surgery

## 2012-05-05 ENCOUNTER — Encounter (INDEPENDENT_AMBULATORY_CARE_PROVIDER_SITE_OTHER): Payer: Medicare Other | Admitting: *Deleted

## 2012-05-05 VITALS — BP 162/79 | HR 78 | Temp 98.3°F | Ht 72.0 in | Wt 227.0 lb

## 2012-05-05 DIAGNOSIS — M79609 Pain in unspecified limb: Secondary | ICD-10-CM

## 2012-05-05 DIAGNOSIS — I739 Peripheral vascular disease, unspecified: Secondary | ICD-10-CM

## 2012-05-05 DIAGNOSIS — I7389 Other specified peripheral vascular diseases: Secondary | ICD-10-CM

## 2012-05-05 NOTE — Progress Notes (Signed)
[facility] [place]  Patient name: [name] MRN: [id number] DOB: 01-04-1934 Sex: male  REASON FOR CONSULT: left hand has episodes of being cold. Referred by Dr. Camillo Flaming  HPI: [name] is a 78 y.o. male who states that for 6-9 months he is noted that when he goes out in the cold to walk his dog, he oftentimes develops coldness in his left hand which resolved after approximately 30 minutes. He states that this involves his entire left hand and not just individual fingers. He has not had any injury to the left arm or exposure to her frostbite. He has not used any vibrating tools in the past which could potentially cause small vessel disease. He has grown up and warm climate for the most part.  These episodes have remained fairly consistent have not changed over the last 6 months. He does not have any significant arm pain his symptoms only involved the left hand.  Past Medical History  Diagnosis Date  . GERD (gastroesophageal reflux disease)   . Barrett's esophagus   . Hypertension   . Depression   . Anemia   . Hyperlipidemia   . BPH (benign prostatic hyperplasia)   . Diverticulosis   . Arthritis   . Sleep disturbance     Family History  Problem Relation Age of Onset  . Hypertension Father   . Dementia Father   . Diabetes Father   . Heart disease Father   . Hyperlipidemia Father   . Heart attack Father   . Cancer Sister     ovarian  . Diabetes Brother   . Depression Brother   . Diabetes Daughter     SOCIAL HISTORY: History  Substance Use Topics  . Smoking status: Former Smoker    Types: Cigarettes    Quit date: 11/07/1998  . Smokeless tobacco: Never Used  . Alcohol Use: 3.0 oz/week    5 Shots of liquor per week    No Known Allergies  Current Outpatient Prescriptions  Medication Sig Dispense Refill  . esomeprazole (NEXIUM) 40 MG capsule Take 40 mg by mouth daily before breakfast.      . Glucosamine HCl-MSM (MSM GLUCOSAMINE PO) Take 30  mLs by mouth.      . hydrochlorothiazide (HYDRODIURIL) 25 MG tablet Take 25 mg by mouth daily.      . metoprolol succinate (TOPROL-XL) 50 MG 24 hr tablet Take 25 mg by mouth 2 (two) times daily. Take with or immediately following a meal.      . Multiple Vitamins-Minerals (CENTRUM SILVER PO) Take by mouth daily.      . Multiple Vitamins-Minerals (SUPER MEGA VITE 75/BETA CARO PO) Take by mouth as directed.      . rosuvastatin (CRESTOR) 10 MG tablet Take 10 mg by mouth daily.      . Tamsulosin HCl (FLOMAX) 0.4 MG CAPS Take 0.4 mg by mouth 2 (two) times daily.      . temazepam (RESTORIL) 15 MG capsule Take 15 mg by mouth at bedtime as needed.        REVIEW OF SYSTEMS: Arly.Keller ] denotes positive finding; [  ] denotes negative finding  CARDIOVASCULAR:  [ ]  chest pain   [ ]  chest pressure   [ ]  palpitations   [ ]  orthopnea   [ ]  dyspnea on exertion   [ ]  claudication   Arly.Keller ] rest pain   [ ]  DVT   [ ]  phlebitis PULMONARY:   [ ]  productive  cough   [ ]  asthma   [ ]  wheezing NEUROLOGIC:   [ ]  weakness  [ ]  paresthesias  [ ]  aphasia  [ ]  amaurosis  [ ]  dizziness HEMATOLOGIC:   [ ]  bleeding problems   [ ]  clotting disorders MUSCULOSKELETAL:  [ ]  joint pain   [ ]  joint swelling [ ]  leg swelling GASTROINTESTINAL: [ ]   blood in stool  [ ]   hematemesis GENITOURINARY:  [ ]   dysuria  [ ]   hematuria PSYCHIATRIC:  [ ]  history of major depression INTEGUMENTARY:  [ ]  rashes  [ ]  ulcers CONSTITUTIONAL:  [ ]  fever   [ ]  chills  PHYSICAL EXAM: Filed Vitals:   05/05/12 1102  BP: 162/79  Pulse: 78  Temp: 98.3 F (36.8 C)  TempSrc: Oral  Height: 6' (1.829 m)  Weight: 227 lb (102.967 kg)  SpO2: 96%   Body mass index is 30.79 kg/(m^2). GENERAL: The patient is a well-nourished male, in no acute distress. The vital signs are documented above. CARDIOVASCULAR: There is a regular rate and rhythm without significant murmur appreciated. I do not detect carotid bruits. He has palpable radial pulses bilaterally. He has a  negative Allen test bilaterally. He has palpable pedal pulses bilaterally. PULMONARY: There is good air exchange bilaterally without wheezing or rales. ABDOMEN: Soft and non-tender with normal pitched bowel sounds. I cannot palpate an abdominal aortic aneurysm. MUSCULOSKELETAL: There are no major deformities or cyanosis. NEUROLOGIC: No focal weakness or paresthesias are detected. SKIN: There are no ulcers or rashes noted. There is no evidence of atheroembolic disease to his left hand PSYCHIATRIC: The patient has a normal affect.  DATA:  I have independently interpreted his arterial Doppler study of his upper cherry's. His arm pressures are equal on both sides. Digital pressures 100% bilaterally. He has triphasic Doppler signals in the subclavian, axillary, brachial, radial, ulnar arteries bilaterally.  I have reviewed his records from Dr. Adella Nissen office. He has a history of hypercholesterolemia and essential hypertension both of which is been well controlled.  MEDICAL ISSUES: I reassured the patient that he does not have any evidence of significant arterial or venous disease. He could have a variant of Raynaud's syndrome. However typically this involves individual digits and not the entire hand. Regardless his symptoms are fairly mild and he does not experience any significant discoloration of his hand pain just becomes cold. Reassure him that this is not anything dangerous or life-threatening and he seems comfortable with that reassurance. Fortunately he is not a smoker. I'll be happy to see him back in the future if any new vascular issues arise.   [name] S [facility]  Beeper: [phone]

## 2012-09-28 ENCOUNTER — Ambulatory Visit: Payer: Medicare Other | Attending: Orthopedic Surgery | Admitting: Physical Therapy

## 2012-09-28 DIAGNOSIS — IMO0001 Reserved for inherently not codable concepts without codable children: Secondary | ICD-10-CM | POA: Insufficient documentation

## 2012-09-28 DIAGNOSIS — M25519 Pain in unspecified shoulder: Secondary | ICD-10-CM | POA: Insufficient documentation

## 2012-10-06 ENCOUNTER — Ambulatory Visit: Payer: Medicare Other | Admitting: Physical Therapy

## 2013-03-08 ENCOUNTER — Encounter: Payer: Self-pay | Admitting: Gastroenterology

## 2013-03-17 ENCOUNTER — Encounter: Payer: Self-pay | Admitting: *Deleted

## 2013-03-29 ENCOUNTER — Other Ambulatory Visit: Payer: Self-pay | Admitting: *Deleted

## 2013-03-29 ENCOUNTER — Ambulatory Visit (INDEPENDENT_AMBULATORY_CARE_PROVIDER_SITE_OTHER): Payer: Medicare Other | Admitting: Gastroenterology

## 2013-03-29 ENCOUNTER — Encounter: Payer: Self-pay | Admitting: Gastroenterology

## 2013-03-29 ENCOUNTER — Other Ambulatory Visit (INDEPENDENT_AMBULATORY_CARE_PROVIDER_SITE_OTHER): Payer: Medicare Other

## 2013-03-29 VITALS — BP 150/80 | HR 84 | Ht 70.0 in | Wt 227.2 lb

## 2013-03-29 DIAGNOSIS — K5730 Diverticulosis of large intestine without perforation or abscess without bleeding: Secondary | ICD-10-CM

## 2013-03-29 DIAGNOSIS — Z86010 Personal history of colonic polyps: Secondary | ICD-10-CM

## 2013-03-29 DIAGNOSIS — K219 Gastro-esophageal reflux disease without esophagitis: Secondary | ICD-10-CM

## 2013-03-29 DIAGNOSIS — K2270 Barrett's esophagus without dysplasia: Secondary | ICD-10-CM

## 2013-03-29 DIAGNOSIS — D649 Anemia, unspecified: Secondary | ICD-10-CM

## 2013-03-29 LAB — IBC PANEL
Iron: 114 ug/dL (ref 42–165)
Saturation Ratios: 28 % (ref 20.0–50.0)

## 2013-03-29 LAB — CBC WITH DIFFERENTIAL/PLATELET
Basophils Relative: 0.5 % (ref 0.0–3.0)
Eosinophils Absolute: 0.3 10*3/uL (ref 0.0–0.7)
Eosinophils Relative: 3.4 % (ref 0.0–5.0)
Hemoglobin: 16.3 g/dL (ref 13.0–17.0)
MCHC: 34.2 g/dL (ref 30.0–36.0)
MCV: 95.5 fl (ref 78.0–100.0)
Monocytes Absolute: 1 10*3/uL (ref 0.1–1.0)
Neutro Abs: 6.2 10*3/uL (ref 1.4–7.7)
RBC: 4.98 Mil/uL (ref 4.22–5.81)
WBC: 8.9 10*3/uL (ref 4.5–10.5)

## 2013-03-29 LAB — FOLATE: Folate: >24.8 ng/mL (ref >5.9)

## 2013-03-29 LAB — FERRITIN: Ferritin: 33.3 ng/mL (ref 22.0–322.0)

## 2013-03-29 MED ORDER — MOVIPREP 100 G PO SOLR: 1.0000 | Freq: Once | ORAL | Status: DC

## 2013-03-29 NOTE — Progress Notes (Signed)
History of Present Illness:  This is a 79 year old retired male with acid reflux symptoms despite Nexium 40 mg a day.  It is unclear as to whether he really has dysphagia.  He also is has some gas and bloating and has a history of colon polyps and is due for followup colonoscopy.  He did say cares a diagnosis of ankylosing spondylitis, diverticulosis, and Barrett's esophagus.  He denies any hepatobiliary complaints, does not abuse alcohol or cigarettes but does take daily Indocin..  I have reviewed this patient's present history, medical and surgical past history, allergies and medications.     ROS:   All systems were reviewed and are negative unless otherwise stated in the HPI.    Physical Exam: Blood pressure 150/80, pulse 84 and regular, weight 227 pounds the BMI of 32.61 recent labs were reviewed there is no CBC. General well developed well nourished patient in no acute distress, appearing their stated age Eyes PERRLA, no icterus, fundoscopic exam per opthamologist Skin no lesions noted Neck supple, no adenopathy, no thyroid enlargement, no tenderness Chest clear to percussion and auscultation Heart no significant murmurs, gallops or rubs noted Abdomen no hepatosplenomegaly masses or tenderness, BS normal.  Rectal inspection normal no fissures, or fistulae noted.  No masses or tenderness on digital exam. Stool guaiac negative. Extremities no acute joint lesions, edema, phlebitis or evidence of cellulitis. Neurologic patient oriented x 3, cranial nerves intact, no focal neurologic deficits noted. Psychological mental status normal and normal affect.  Assessment and plan: Chronic GERD with known Barrett's mucosa, rule out esophageal stricture, he also needs dysplasia biopsies.  Additionally, he may have an NSAID damage to his upper gut.  He is due for followup colonoscopy she also is been scheduled at his convenience with nurse anesthesia attendance.  Ordered CBC and anemia  profile.  Encounter Diagnoses  Name Primary?  . Barrett's esophagus Yes  . GERD (gastroesophageal reflux disease)   . Anemia   . Diverticulosis of colon without hemorrhage

## 2013-03-29 NOTE — Patient Instructions (Addendum)
You have been scheduled for an endoscopy and colonoscopy with propofol. Please follow the written instructions given to you at your visit today. Please pick up your prep at the pharmacy within the next 1-3 days. If you use inhalers (even only as needed), please bring them with you on the day of your procedure. Your physician has requested that you go to www.startemmi.com and enter the access code given to you at your visit today. This web site gives a general overview about your procedure. However, you should still follow specific instructions given to you by our office regarding your preparation for the procedure.  Your physician has requested that you go to the basement for lab work before leaving today ________________________________________________________________________________________________________________________________________________________________________________________________  Bonita Quin have been scheduled for an esophageal manometry at [facility] on 04-11-2013 at 9:30 am. Please arrive 30 minutes prior to your procedure for registration. You will need to go to outpatient registration (1st floor of the hospital) first. Make certain to bring your insurance cards as well as a complete list of medications.  Please remember the following:  1) Nothing to eat or drink after 12:00 midnight on the night before your test.  2) Hold all diabetic medications/insulin the morning of the test. You may eat and take your medications after the test.  3) For 3 days prior to your test do not take: Dexilant, Prevacid, Nexium, Protonix, Aciphex, Zegerid, Pantoprazole, Prilosec or omeprazole.  4) For 2 days prior to your test, do not take: Reglan, Tagamet, Zantac, Axid or Pepcid.  5) You MAY use an antacid such as Rolaids or Tums up to 12 hours prior to your test.  It will take at least 2 weeks to receive the results of this test from your  physician. ------------------------------------------ ABOUT ESOPHAGEAL MANOMETRY Esophageal manometry (muh-NOM-uh-tree) is a test that gauges how well your esophagus works. Your esophagus is the long, muscular tube that connects your throat to your stomach. Esophageal manometry measures the rhythmic muscle contractions (peristalsis) that occur in your esophagus when you swallow. Esophageal manometry also measures the coordination and force exerted by the muscles of your esophagus.  During esophageal manometry, a thin, flexible tube (catheter) that contains sensors is passed through your nose, down your esophagus and into your stomach. Esophageal manometry can be helpful in diagnosing some mostly uncommon disorders that affect your esophagus.  Why it's done Esophageal manometry is used to evaluate the movement (motility) of food through the esophagus and into the stomach. The test measures how well the circular bands of muscle (sphincters) at the top and bottom of your esophagus open and close, as well as the pressure, strength and pattern of the wave of esophageal muscle contractions that moves food along.  What you can expect Esophageal manometry is an outpatient procedure done without sedation. Most people tolerate it well. You may be asked to change into a hospital gown before the test starts.  During esophageal manometry  While you are sitting up, a member of your health care team sprays your throat with a numbing medication or puts numbing gel in your nose or both.  A catheter is guided through your nose into your esophagus. The catheter may be sheathed in a water-filled sleeve. It doesn't interfere with your breathing. However, your eyes may water, and you may gag. You may have a slight nosebleed from irritation.  After the catheter is in place, you may be asked to lie on your back on an exam table, or you may be asked to  remain seated.  You then swallow small sips of water. As you do, a computer  connected to the catheter records the pressure, strength and pattern of your esophageal muscle contractions.  During the test, you'll be asked to breathe slowly and smoothly, remain as still as possible, and swallow only when you're asked to do so.  A member of your health care team may move the catheter down into your stomach while the catheter continues its measurements.  The catheter then is slowly withdrawn. The test usually lasts 20 to 30 minutes.  After esophageal manometry  When your esophageal manometry is complete, you may return to your normal activities  This test typically takes 30-45 minutes to complete. ________________________________________________________________________________                                               We are excited to introduce MyChart, a new best-in-class service that provides you online access to important information in your electronic medical record. We want to make it easier for you to view your health information - all in one secure location - when and where you need it. We expect MyChart will enhance the quality of care and service we provide.  When you register for MyChart, you can:    View your test results.    Request appointments and receive appointment reminders via email.    Request medication renewals.    View your medical history, allergies, medications and immunizations.    Communicate with your physician's office through a password-protected site.    Conveniently print information such as your medication lists.  To find out if MyChart is right for you, please talk to a member of our clinical staff today. We will gladly answer your questions about this free health and wellness tool.  If you are age 77 or older and want a member of your family to have access to your record, you must provide written consent by completing a proxy form available at our office. Please speak to our clinical staff about guidelines regarding accounts  for patients younger than age 69.  As you activate your MyChart account and need any technical assistance, please call the MyChart technical support line at (336) 83-CHART [phone]) or email your question to [email or web address]. If you email your question(s), please include your name, a return phone number and the best time to reach you.  If you have non-urgent health-related questions, you can send a message to our office through MyChart at [email or web address]. If you have a medical emergency, call 911.  Thank you for using MyChart as your new health and wellness resource!   MyChart licensed from [employer],  [id number]. Patents Pending.

## 2013-04-06 ENCOUNTER — Encounter: Payer: Self-pay | Admitting: Gastroenterology

## 2013-04-06 ENCOUNTER — Ambulatory Visit (AMBULATORY_SURGERY_CENTER): Payer: Medicare Other | Admitting: Gastroenterology

## 2013-04-06 VITALS — BP 117/61 | HR 77 | Temp 98.0°F | Resp 45 | Ht 70.0 in | Wt 227.0 lb

## 2013-04-06 DIAGNOSIS — Z86010 Personal history of colonic polyps: Secondary | ICD-10-CM

## 2013-04-06 DIAGNOSIS — K5730 Diverticulosis of large intestine without perforation or abscess without bleeding: Secondary | ICD-10-CM

## 2013-04-06 DIAGNOSIS — Z8719 Personal history of other diseases of the digestive system: Secondary | ICD-10-CM

## 2013-04-06 DIAGNOSIS — K2270 Barrett's esophagus without dysplasia: Secondary | ICD-10-CM

## 2013-04-06 DIAGNOSIS — K219 Gastro-esophageal reflux disease without esophagitis: Secondary | ICD-10-CM

## 2013-04-06 DIAGNOSIS — D649 Anemia, unspecified: Secondary | ICD-10-CM

## 2013-04-06 DIAGNOSIS — K449 Diaphragmatic hernia without obstruction or gangrene: Secondary | ICD-10-CM

## 2013-04-06 MED ORDER — SODIUM CHLORIDE 0.9 % IV SOLN: 500.0000 mL | INTRAVENOUS | Status: DC

## 2013-04-06 NOTE — Progress Notes (Signed)
Called to room to assist during endoscopic procedure.  Patient ID and intended procedure confirmed with present staff. Received instructions for my participation in the procedure from the performing physician.  

## 2013-04-06 NOTE — Progress Notes (Signed)
Lidocaine-40mg IV prior to Propofol InductionPropofol given over incremental dosages 

## 2013-04-06 NOTE — Progress Notes (Signed)
Patient did not experience any of the following events: a burn prior to discharge; a fall within the facility; wrong site/side/patient/procedure/implant event; or a hospital transfer or hospital admission upon discharge from the facility. (G8907) Patient did not have preoperative order for IV antibiotic SSI prophylaxis. (G8918)  

## 2013-04-06 NOTE — Op Note (Signed)
[facility] [address] [place], [address]   ENDOSCOPY PROCEDURE REPORT  PATIENT: Jordan, Fuentes  MR#: [id number] BIRTHDATE: 1934-08-30 , 79  yrs. old GENDER: Male ENDOSCOPIST:Orella Cushman Hale Bogus, MD, Clementeen Graham REFERRED BY: Berenda Morale, M.D. PROCEDURE DATE:  04/06/2013 PROCEDURE:   EGD w/ biopsy ASA CLASS:    Class II INDICATIONS: Dysphagia, History of reflux esophagitis, and Barrett's screening. MEDICATION: There was residual sedation effect present from prior procedure and propofol (Diprivan) 100mg  IV TOPICAL ANESTHETIC:  DESCRIPTION OF PROCEDURE:   After the risks and benefits of the procedure were explained, informed consent was obtained.  The LB GIF-H180 [id number]  endoscope was introduced through the mouth  and advanced to the second portion of the duodenum .  The instrument was slowly withdrawn as the mucosa was fully examined.      DUODENUM: The duodenal mucosa showed no abnormalities in the bulb and second portion of the duodenum.  ESOPHAGUS: A large hiatal hernia was noted. Cameron erosions and heme in large hernia sac.  There was evidence of suspected Barrett's esophagus at the gastroesophageal junction.Bio0psies done.    Retroflexed views revealed a large  hiatal hernia.    The scope was then withdrawn from the patient and the procedure completed.  STOMACH:large nodular rugaenno erosions or ulcers note.  COMPLICATIONS: There were no complications.   ENDOSCOPIC IMPRESSION: 1.   The duodenal mucosa showed no abnormalities in the bulb and second portion of the duodenum 2.   Large hiatal hernia and Cameron erosions in hernia. 3.   There was evidence of suspected Barrett's esophagus ..biopsies done.  RECOMMENDATIONS: 1.  Await pathology results 2.  Continue current medications 3.  Continue PPI    _______________________________ eSigned:  Mardella Layman, MD, [facility] 04/06/2013 2:45 PM   standard discharge   PATIENT NAME:  Jordan, Fuentes MR#: [id number]

## 2013-04-06 NOTE — Patient Instructions (Signed)

## 2013-04-06 NOTE — Op Note (Signed)
[facility] [address] [place], [address]   COLONOSCOPY PROCEDURE REPORT  PATIENT: [name], [name]  MR#: [id number] BIRTHDATE: Feb 01, 1934 , 79  yrs. old GENDER: Male ENDOSCOPIST: Mardella Layman, MD, [facility] REFERRED BY: PROCEDURE DATE:  04/06/2013 PROCEDURE:   Colonoscopy, surveillance ASA CLASS:   Class II INDICATIONS:Patient's personal history of adenomatous colon polyps.  MEDICATIONS: propofol (Diprivan) 100mg  IV  DESCRIPTION OF PROCEDURE:   After the risks and benefits and of the procedure were explained, informed consent was obtained.  A digital rectal exam revealed no abnormalities of the rectum.    The LB CF-H180AL [id number]  endoscope was introduced through the anus and advanced to the cecum, which was identified by both the appendix and ileocecal valve .  The quality of the prep was good, using MoviPrep .  The instrument was then slowly withdrawn as the colon was fully examined.     COLON FINDINGS: Moderate diverticulosis was noted in the descending colon and sigmoid colon.   The colon was otherwise normal.  There was no diverticulosis, inflammation, polyps or cancers unless previously stated.     Retroflexed views revealed no abnormalities. The scope was then withdrawn from the patient and the procedure completed.  COMPLICATIONS: There were no complications. ENDOSCOPIC IMPRESSION: 1.   Moderate diverticulosis was noted in the descending colon and sigmoid colon 2.   The colon was otherwise normal ..no recurrent polyps noted.  RECOMMENDATIONS: 1.  High fiber diet with liberal fluid intake. 2.  Continue current medications   REPEAT EXAM:  cc:  _______________________________ eSignedMardella Layman, MD, [facility] 04/06/2013 2:33 PM     PATIENT NAME:  [name], [name] MR#: [id number]

## 2013-04-07 ENCOUNTER — Telehealth: Payer: Self-pay | Admitting: *Deleted

## 2013-04-07 NOTE — Telephone Encounter (Signed)
  Follow up Call-  Call back number 04/06/2013  Post procedure Call Back phone  # [phone]  Permission to leave phone message Yes     Patient questions:  Do you have a fever, pain , or abdominal swelling? no Pain Score  0 *  Have you tolerated food without any problems? yes  Have you been able to return to your normal activities? yes  Do you have any questions about your discharge instructions: Diet   no Medications  yes Follow up visit  no  Do you have questions or concerns about your Care? no  Actions: * If pain score is 4 or above: No action needed, pain <4. Pt wants to know if it would be beneficial to his treatment to take Nexium in am and generic nexium at bedtime.  In other words twice daily. Please advise.Marland KitchenMarland Kitchen

## 2013-04-11 ENCOUNTER — Ambulatory Visit (HOSPITAL_COMMUNITY): Admission: RE | Admit: 2013-04-11 | Payer: Medicare Other | Source: Ambulatory Visit | Admitting: Gastroenterology

## 2013-04-11 ENCOUNTER — Encounter (HOSPITAL_COMMUNITY): Admission: RE | Payer: Self-pay | Source: Ambulatory Visit

## 2013-04-11 SURGERY — MANOMETRY, ESOPHAGUS

## 2013-04-13 ENCOUNTER — Encounter: Payer: Self-pay | Admitting: Gastroenterology

## 2015-04-23 ENCOUNTER — Emergency Department (HOSPITAL_COMMUNITY)
Admission: EM | Admit: 2015-04-23 | Discharge: 2015-04-23 | Disposition: A | Discharge status: Home / Self Care | Payer: Medicare Other | Attending: Emergency Medicine | Admitting: Emergency Medicine

## 2015-04-23 ENCOUNTER — Emergency Department (HOSPITAL_COMMUNITY): Payer: Medicare Other

## 2015-04-23 ENCOUNTER — Encounter (HOSPITAL_COMMUNITY): Payer: Self-pay | Admitting: Emergency Medicine

## 2015-04-23 DIAGNOSIS — I10 Essential (primary) hypertension: Secondary | ICD-10-CM | POA: Diagnosis not present

## 2015-04-23 DIAGNOSIS — Y9289 Other specified places as the place of occurrence of the external cause: Secondary | ICD-10-CM | POA: Insufficient documentation

## 2015-04-23 DIAGNOSIS — Z96642 Presence of left artificial hip joint: Secondary | ICD-10-CM | POA: Diagnosis not present

## 2015-04-23 DIAGNOSIS — Z862 Personal history of diseases of the blood and blood-forming organs and certain disorders involving the immune mechanism: Secondary | ICD-10-CM | POA: Insufficient documentation

## 2015-04-23 DIAGNOSIS — M1990 Unspecified osteoarthritis, unspecified site: Secondary | ICD-10-CM | POA: Diagnosis not present

## 2015-04-23 DIAGNOSIS — Z79899 Other long term (current) drug therapy: Secondary | ICD-10-CM | POA: Insufficient documentation

## 2015-04-23 DIAGNOSIS — E785 Hyperlipidemia, unspecified: Secondary | ICD-10-CM | POA: Diagnosis not present

## 2015-04-23 DIAGNOSIS — Z8659 Personal history of other mental and behavioral disorders: Secondary | ICD-10-CM | POA: Insufficient documentation

## 2015-04-23 DIAGNOSIS — Y998 Other external cause status: Secondary | ICD-10-CM | POA: Diagnosis not present

## 2015-04-23 DIAGNOSIS — Y9301 Activity, walking, marching and hiking: Secondary | ICD-10-CM | POA: Insufficient documentation

## 2015-04-23 DIAGNOSIS — K219 Gastro-esophageal reflux disease without esophagitis: Secondary | ICD-10-CM | POA: Diagnosis not present

## 2015-04-23 DIAGNOSIS — Z791 Long term (current) use of non-steroidal anti-inflammatories (NSAID): Secondary | ICD-10-CM | POA: Insufficient documentation

## 2015-04-23 DIAGNOSIS — S79912A Unspecified injury of left hip, initial encounter: Secondary | ICD-10-CM | POA: Diagnosis present

## 2015-04-23 DIAGNOSIS — Z8669 Personal history of other diseases of the nervous system and sense organs: Secondary | ICD-10-CM | POA: Insufficient documentation

## 2015-04-23 DIAGNOSIS — Z87891 Personal history of nicotine dependence: Secondary | ICD-10-CM | POA: Insufficient documentation

## 2015-04-23 DIAGNOSIS — N400 Enlarged prostate without lower urinary tract symptoms: Secondary | ICD-10-CM | POA: Insufficient documentation

## 2015-04-23 DIAGNOSIS — X58XXXA Exposure to other specified factors, initial encounter: Secondary | ICD-10-CM | POA: Diagnosis not present

## 2015-04-23 DIAGNOSIS — R52 Pain, unspecified: Secondary | ICD-10-CM

## 2015-04-23 DIAGNOSIS — M25552 Pain in left hip: Secondary | ICD-10-CM

## 2015-04-23 NOTE — ED Provider Notes (Signed)
CSN: [id number]     Arrival date & time 04/23/15  1306 History   First MD Initiated Contact with Patient 04/23/15 1527     Chief Complaint  Patient presents with  . Hip Pain     (Consider location/radiation/quality/duration/timing/severity/associated sxs/prior Treatment) The history is provided by the patient and medical records.     Patient with hx left hip replacement x 4 with recurrent discolations p/w left hip pain that began yesterday.  States during the day he walked around [facility] at a fast pace and climbed many stairs.  Around 8pm he was sitting in his recliner, pushed the foot rest back with his heels and felt a snap in his left hip.  The pain was 3-4/10 at the time.  He has mild pain with movement of his leg posteriorly.  Denies fevers, chills, abdominal pain, back pain, groin pain, weakness or numbness of the leg.  Orthopedist is Dr Binnie Rail.    Past Medical History  Diagnosis Date  . GERD (gastroesophageal reflux disease)   . Barrett's esophagus 2004  . Hypertension   . Depression   . Anemia   . Hyperlipidemia   . BPH (benign prostatic hyperplasia)   . Diverticulosis   . Arthritis 1974  . Sleep disturbance   . Hiatal hernia    Past Surgical History  Procedure Laterality Date  . Inguinal hernia repair Bilateral 1997    x2  . Joint replacement Left     total left hip 1984  . Umbilical hernia repair    . Total hip arthroplasty Left 1985, 1999, 2009, 2010    left x4  . Toe surgery Left     left great toe   Family History  Problem Relation Age of Onset  . Hypertension Father   . Dementia Father   . Diabetes Father   . Heart disease Father   . Hyperlipidemia Father   . Heart attack Father   . Ovarian cancer Sister   . Diabetes Brother   . Depression Brother   . Diabetes Daughter    History  Substance Use Topics  . Smoking status: Former Smoker    Types: Cigarettes    Quit date: 11/07/1998  . Smokeless tobacco: Never Used  . Alcohol Use: 3.0  oz/week    5 Shots of liquor per week    Review of Systems  Constitutional: Negative for fever and chills.  Cardiovascular: Negative for leg swelling.  Gastrointestinal: Negative for abdominal pain.  Musculoskeletal: Positive for arthralgias. Negative for back pain, joint swelling and gait problem.  Skin: Negative for color change.  Allergic/Immunologic: Negative for immunocompromised state.  Neurological: Negative for weakness and numbness.  Hematological: Does not bruise/bleed easily.  Psychiatric/Behavioral: Negative for self-injury.      Allergies  Review of patient's allergies indicates no known allergies.  Home Medications   Prior to Admission medications   Medication Sig Start Date End Date Taking? Authorizing Provider  esomeprazole (NEXIUM) 40 MG capsule Take 40 mg by mouth daily before breakfast.    Historical Provider, MD  Glucosamine HCl-MSM (MSM GLUCOSAMINE PO) Take 30 mLs by mouth.    Historical Provider, MD  hydrochlorothiazide (HYDRODIURIL) 25 MG tablet Take 25 mg by mouth daily.    Historical Provider, MD  indomethacin (INDOCIN) 25 MG capsule Take 25 mg by mouth 2 (two) times daily with a meal.    Historical Provider, MD  metoprolol succinate (TOPROL-XL) 50 MG 24 hr tablet Take 25 mg by mouth daily. Take with  or immediately following a meal.    Historical Provider, MD  Multiple Vitamins-Minerals (SUPER MEGA VITE 75/BETA CARO PO) Take by mouth as directed.    Historical Provider, MD  rosuvastatin (CRESTOR) 10 MG tablet Take 10 mg by mouth daily.    Historical Provider, MD  Tamsulosin HCl (FLOMAX) 0.4 MG CAPS Take 0.4 mg by mouth 2 (two) times daily.    Historical Provider, MD  temazepam (RESTORIL) 15 MG capsule Take 15 mg by mouth at bedtime as needed.    Historical Provider, MD   BP 169/77 mmHg  Pulse 87  Temp(Src) 98.3 F (36.8 C) (Oral)  Resp 16  SpO2 94% Physical Exam  Constitutional: He appears well-developed and well-nourished. No distress.  HENT:   Head: Normocephalic and atraumatic.  Neck: Neck supple.  Pulmonary/Chest: Effort normal.  Musculoskeletal: Normal range of motion. He exhibits no edema or tenderness.  Left hip with full passive ROM without pain.  Nontender.  No erythema, edema, warmth.  Strength 5/5, distal sensation and pulses intact.    Spine nontender, no crepitus, or stepoffs.   Neurological: He is alert.  Skin: He is not diaphoretic.  Nursing note and vitals reviewed.   ED Course  Procedures (including critical care time) Labs Review Labs Reviewed - No data to display  Imaging Review Dg Hip Unilat With Pelvis 2-3 Views Left  04/23/2015   CLINICAL DATA:  History of left hip replacement with prior left hip dislocations. Acute onset severe pain 04/21/2015.  EXAM: LEFT HIP (WITH PELVIS) 2-3 VIEWS  COMPARISON:  Plain films left hip 01/22/2009.  FINDINGS: Left hip arthroplasty is in place. The device is located. No fracture is identified. Heterotopic calcification about the femoral component is unchanged. Moderate appearing right hip osteoarthritis is noted.  IMPRESSION: No acute abnormality in patient with a left hip replacement.   Electronically Signed   By: Inge Rise M.D.   On: 04/23/2015 15:20     EKG Interpretation None       3:40 PM Discussed pt with Dr Venora Maples who will also see the patient.  Xray reviewed with Dr Venora Maples.    MDM   Final diagnoses:  History of hip replacement, total, left  Left hip pain    Afebrile, nontoxic patient with injury to his left hip while pushing armchair footrest backward, after long day of walking at fast pace and climbing many stairs.    Xray negative.  Pt also seen by Dr Venora Maples with discussion of plan. Pt offered MRI but pt declined.   D/C home with orthopedic follow up.  Discussed result, findings, treatment, and follow up  with patient.  Pt given return precautions.  Pt verbalizes understanding and agrees with plan.        Clayton Bibles, PA-C 04/23/15 [place], MD 04/24/15 [phone]

## 2015-04-23 NOTE — ED Notes (Signed)
Pt c/o left hip pain that started last night after he closed the foot rest on his recliner with his heels.  Pt states that he had left hip replacement in the past.  Pt also did a lot of walking yesterday at [facility] graduation.

## 2016-02-14 DIAGNOSIS — J209 Acute bronchitis, unspecified: Secondary | ICD-10-CM | POA: Diagnosis not present

## 2016-02-14 DIAGNOSIS — I10 Essential (primary) hypertension: Secondary | ICD-10-CM | POA: Diagnosis not present

## 2016-03-06 DIAGNOSIS — E7800 Pure hypercholesterolemia, unspecified: Secondary | ICD-10-CM | POA: Diagnosis not present

## 2016-03-06 DIAGNOSIS — K219 Gastro-esophageal reflux disease without esophagitis: Secondary | ICD-10-CM | POA: Diagnosis not present

## 2016-03-06 DIAGNOSIS — R7301 Impaired fasting glucose: Secondary | ICD-10-CM | POA: Diagnosis not present

## 2016-03-06 DIAGNOSIS — I10 Essential (primary) hypertension: Secondary | ICD-10-CM | POA: Diagnosis not present

## 2016-04-29 DIAGNOSIS — Z0000 Encounter for general adult medical examination without abnormal findings: Secondary | ICD-10-CM | POA: Diagnosis not present

## 2016-04-29 DIAGNOSIS — N138 Other obstructive and reflux uropathy: Secondary | ICD-10-CM | POA: Diagnosis not present

## 2016-04-29 DIAGNOSIS — N401 Enlarged prostate with lower urinary tract symptoms: Secondary | ICD-10-CM | POA: Diagnosis not present

## 2016-04-29 DIAGNOSIS — N3941 Urge incontinence: Secondary | ICD-10-CM | POA: Diagnosis not present

## 2016-05-01 DIAGNOSIS — L821 Other seborrheic keratosis: Secondary | ICD-10-CM | POA: Diagnosis not present

## 2016-05-01 DIAGNOSIS — L84 Corns and callosities: Secondary | ICD-10-CM | POA: Diagnosis not present

## 2016-05-01 DIAGNOSIS — L814 Other melanin hyperpigmentation: Secondary | ICD-10-CM | POA: Diagnosis not present

## 2016-05-01 DIAGNOSIS — L570 Actinic keratosis: Secondary | ICD-10-CM | POA: Diagnosis not present

## 2016-05-01 DIAGNOSIS — D225 Melanocytic nevi of trunk: Secondary | ICD-10-CM | POA: Diagnosis not present

## 2016-05-01 DIAGNOSIS — D1801 Hemangioma of skin and subcutaneous tissue: Secondary | ICD-10-CM | POA: Diagnosis not present

## 2016-06-13 DIAGNOSIS — R350 Frequency of micturition: Secondary | ICD-10-CM | POA: Diagnosis not present

## 2016-06-13 DIAGNOSIS — N3941 Urge incontinence: Secondary | ICD-10-CM | POA: Diagnosis not present

## 2016-06-26 DIAGNOSIS — N401 Enlarged prostate with lower urinary tract symptoms: Secondary | ICD-10-CM | POA: Diagnosis not present

## 2016-06-26 DIAGNOSIS — N3941 Urge incontinence: Secondary | ICD-10-CM | POA: Diagnosis not present

## 2016-08-12 DIAGNOSIS — Z23 Encounter for immunization: Secondary | ICD-10-CM | POA: Diagnosis not present

## 2016-08-12 DIAGNOSIS — M25551 Pain in right hip: Secondary | ICD-10-CM | POA: Diagnosis not present

## 2016-08-29 DIAGNOSIS — M25551 Pain in right hip: Secondary | ICD-10-CM | POA: Diagnosis not present

## 2016-08-29 DIAGNOSIS — M1611 Unilateral primary osteoarthritis, right hip: Secondary | ICD-10-CM | POA: Diagnosis not present

## 2016-09-05 DIAGNOSIS — K219 Gastro-esophageal reflux disease without esophagitis: Secondary | ICD-10-CM | POA: Diagnosis not present

## 2016-09-05 DIAGNOSIS — G479 Sleep disorder, unspecified: Secondary | ICD-10-CM | POA: Diagnosis not present

## 2016-09-05 DIAGNOSIS — E7800 Pure hypercholesterolemia, unspecified: Secondary | ICD-10-CM | POA: Diagnosis not present

## 2016-09-05 DIAGNOSIS — M25551 Pain in right hip: Secondary | ICD-10-CM | POA: Diagnosis not present

## 2016-09-05 DIAGNOSIS — R7301 Impaired fasting glucose: Secondary | ICD-10-CM | POA: Diagnosis not present

## 2016-09-05 DIAGNOSIS — F339 Major depressive disorder, recurrent, unspecified: Secondary | ICD-10-CM | POA: Diagnosis not present

## 2016-09-05 DIAGNOSIS — I10 Essential (primary) hypertension: Secondary | ICD-10-CM | POA: Diagnosis not present

## 2016-09-05 DIAGNOSIS — K2270 Barrett's esophagus without dysplasia: Secondary | ICD-10-CM | POA: Diagnosis not present

## 2016-09-18 DIAGNOSIS — M1611 Unilateral primary osteoarthritis, right hip: Secondary | ICD-10-CM | POA: Diagnosis not present

## 2016-10-02 DIAGNOSIS — M25462 Effusion, left knee: Secondary | ICD-10-CM | POA: Diagnosis not present

## 2016-10-02 DIAGNOSIS — M1611 Unilateral primary osteoarthritis, right hip: Secondary | ICD-10-CM | POA: Diagnosis not present

## 2016-10-16 DIAGNOSIS — M1611 Unilateral primary osteoarthritis, right hip: Secondary | ICD-10-CM | POA: Diagnosis not present

## 2016-10-21 DIAGNOSIS — M1611 Unilateral primary osteoarthritis, right hip: Secondary | ICD-10-CM | POA: Diagnosis not present

## 2016-10-23 DIAGNOSIS — M1611 Unilateral primary osteoarthritis, right hip: Secondary | ICD-10-CM | POA: Diagnosis not present

## 2016-10-28 DIAGNOSIS — M1611 Unilateral primary osteoarthritis, right hip: Secondary | ICD-10-CM | POA: Diagnosis not present

## 2016-11-04 DIAGNOSIS — M1611 Unilateral primary osteoarthritis, right hip: Secondary | ICD-10-CM | POA: Diagnosis not present

## 2016-11-06 DIAGNOSIS — M1611 Unilateral primary osteoarthritis, right hip: Secondary | ICD-10-CM | POA: Diagnosis not present

## 2016-11-07 ENCOUNTER — Encounter (INDEPENDENT_AMBULATORY_CARE_PROVIDER_SITE_OTHER): Payer: Self-pay

## 2016-11-07 ENCOUNTER — Ambulatory Visit (INDEPENDENT_AMBULATORY_CARE_PROVIDER_SITE_OTHER): Payer: Commercial Managed Care - HMO | Admitting: Gastroenterology

## 2016-11-07 ENCOUNTER — Encounter: Payer: Self-pay | Admitting: Gastroenterology

## 2016-11-07 VITALS — BP 164/78 | HR 84 | Ht 70.0 in | Wt 229.4 lb

## 2016-11-07 DIAGNOSIS — K449 Diaphragmatic hernia without obstruction or gangrene: Secondary | ICD-10-CM | POA: Diagnosis not present

## 2016-11-07 DIAGNOSIS — K2270 Barrett's esophagus without dysplasia: Secondary | ICD-10-CM

## 2016-11-07 NOTE — Patient Instructions (Signed)
If you are age 80 or older, your body mass index should be between 23-30. Your Body mass index is 32.91 kg/m. If this is out of the aforementioned range listed, please consider follow up with your Primary Care Provider.  If you are age 42 or younger, your body mass index should be between 19-25. Your Body mass index is 32.91 kg/m. If this is out of the aformentioned range listed, please consider follow up with your Primary Care Provider.   You have been scheduled for an endoscopy. Please follow written instructions given to you at your visit today. If you use inhalers (even only as needed), please bring them with you on the day of your procedure. Your physician has requested that you go to www.startemmi.com and enter the access code given to you at your visit today. This web site gives a general overview about your procedure. However, you should still follow specific instructions given to you by our office regarding your preparation for the procedure.  Thank you for choosing [facility]  Dr Wilfrid Lund III

## 2016-11-07 NOTE — Progress Notes (Signed)
[facility] Progress Note  Chief Complaint: Barrett's esophagus  Subjective  History:  [name] sees me after previously having been cared for by Dr. Sharlett Iles. In 2004 he was sent for PDT in [facility]. It is not clear if he has ever had dysplastic Barrett's. There was no dysplasia in 2007, 2011, 2014. Last endoscopy in 2014 showed a moderate size hiatal hernia with Lysbeth Galas erosions. They'll has rare episodes of dysphagia or regurgitation. The length of Barrett's is not indicated on his last EGD report.  ROS: Cardiovascular:  no chest pain Respiratory: no dyspnea  The patient's Past Medical, Family and Social History were reviewed and are on file in the EMR. Past Medical History:  Diagnosis Date  . Anemia   . Arthritis 1974  . Barrett's esophagus 2004  . BPH (benign prostatic hyperplasia)   . Depression   . Diverticulosis   . GERD (gastroesophageal reflux disease)   . Hiatal hernia   . Hyperlipidemia   . Hypertension   . Sleep disturbance     Current Outpatient Prescriptions:  .  atorvastatin (LIPITOR) 40 MG tablet, Take 40 mg by mouth daily., Disp: , Rfl:  .  hydrochlorothiazide (HYDRODIURIL) 25 MG tablet, Take 25 mg by mouth daily. PT STATES HE IS TAKING 1/2 TABLET IN THE A.M., Disp: , Rfl:  .  Multiple Vitamins-Minerals (SUPER MEGA VITE 75/BETA CARO PO), Take by mouth as directed., Disp: , Rfl:  .  naproxen (NAPROSYN) 375 MG tablet, Take 375 mg by mouth 2 (two) times daily., Disp: , Rfl: 0 .  omeprazole (PRILOSEC) 40 MG capsule, Take 40 mg by mouth 2 (two) times daily., Disp: , Rfl:  .  OVER THE COUNTER MEDICATION, Liquid joint liquid, Disp: , Rfl:  .  Tamsulosin HCl (FLOMAX) 0.4 MG CAPS, Take 0.4 mg by mouth 2 (two) times daily., Disp: , Rfl:   Family History  Problem Relation Age of Onset  . Hypertension Father   . Dementia Father   . Diabetes Father   . Heart disease Father   . Hyperlipidemia Father   . Heart attack Father   . Diabetes Brother   .  Depression Brother   . Ovarian cancer Sister   . Diabetes Daughter   . Colon cancer Neg Hx   . Stomach cancer Neg Hx   . Esophageal cancer Neg Hx   . Rectal cancer Neg Hx   . Liver cancer Neg Hx    Objective:  Med list reviewed  Vital signs in last 24 hrs: Vitals:   11/07/16 1544  BP: (!) 164/78  Pulse: 84    Physical Exam    HEENT: sclera anicteric, oral mucosa moist without lesions  Neck: supple, no thyromegaly, JVD or lymphadenopathy  Cardiac: RRR without murmurs, S1S2 heard, no peripheral edema  Pulm: clear to auscultation bilaterally, normal RR and effort noted  Abdomen: soft, No tenderness, with active bowel sounds. No guarding or palpable hepatosplenomegaly.  Skin; warm and dry, no jaundice or rash    @ASSESSMENTPLANBEGIN @ Assessment: Encounter Diagnoses  Name Primary?  . Barrett's esophagus without dysplasia Yes  . Hiatal hernia without gangrene and obstruction     Plan: EGD for Barrett's surveillance  The benefits and risks of the planned procedure were described in detail with the patient or (when appropriate) their health care proxy.  Risks were outlined as including, but not limited to, bleeding, infection, perforation, adverse medication reaction leading to cardiac or pulmonary decompensation, or pancreatitis (if ERCP).  The limitation of incomplete mucosal  visualization was also discussed.  No guarantees or warranties were given.   [name]

## 2016-11-11 DIAGNOSIS — M1611 Unilateral primary osteoarthritis, right hip: Secondary | ICD-10-CM | POA: Diagnosis not present

## 2016-11-13 DIAGNOSIS — M1611 Unilateral primary osteoarthritis, right hip: Secondary | ICD-10-CM | POA: Diagnosis not present

## 2016-11-28 DIAGNOSIS — M1611 Unilateral primary osteoarthritis, right hip: Secondary | ICD-10-CM | POA: Diagnosis not present

## 2016-12-12 DIAGNOSIS — M489 Spondylopathy, unspecified: Secondary | ICD-10-CM | POA: Diagnosis not present

## 2016-12-12 DIAGNOSIS — K2270 Barrett's esophagus without dysplasia: Secondary | ICD-10-CM | POA: Diagnosis not present

## 2016-12-12 DIAGNOSIS — L989 Disorder of the skin and subcutaneous tissue, unspecified: Secondary | ICD-10-CM | POA: Diagnosis not present

## 2016-12-12 DIAGNOSIS — M459 Ankylosing spondylitis of unspecified sites in spine: Secondary | ICD-10-CM | POA: Diagnosis not present

## 2016-12-15 DIAGNOSIS — C44119 Basal cell carcinoma of skin of left eyelid, including canthus: Secondary | ICD-10-CM | POA: Diagnosis not present

## 2016-12-15 DIAGNOSIS — C44319 Basal cell carcinoma of skin of other parts of face: Secondary | ICD-10-CM | POA: Diagnosis not present

## 2016-12-23 DIAGNOSIS — L905 Scar conditions and fibrosis of skin: Secondary | ICD-10-CM | POA: Diagnosis not present

## 2016-12-23 DIAGNOSIS — L821 Other seborrheic keratosis: Secondary | ICD-10-CM | POA: Diagnosis not present

## 2016-12-30 ENCOUNTER — Encounter: Payer: Commercial Managed Care - HMO | Admitting: Gastroenterology

## 2017-01-09 ENCOUNTER — Ambulatory Visit: Payer: Self-pay | Admitting: Orthopedic Surgery

## 2017-01-09 NOTE — H&P (Signed)
[name] DOB: Jul 25, 1934 Married / Language: English / Race: White Male Date of Admission:  02/04/2017 CC:  Right hip pain and stiffness History of Present Illness  The patient is a 83 year old male who comes in for a preoperative History and Physical. The patient is scheduled for a right total hip arthroplasty (anterior) to be performed by Dr. Dione Plover. Aluisio, MD at [facility] on 02-04-2017. The patient is a 83 year old male who presents today for follow up of their hip. The patient is being followed for their right hip pain and osteoarthritis. Symptoms reported include: pain. The patient feels that they are worse with time and report their pain level to be mild to moderate. Current treatment includes: activity modification. The patient has reported improvement of their symptoms with: Cortisone injections and physical therapy. He is feeling some better from the cortisone injection and from the physical therapy but not as much as he would like. He is worried that he is going to become more and more limited in his activity. He is considering total hip replacement at this point. Unfortunately, his right hip is getting progressively worse over time. It is definitely starting to limit what he can and cannot do. Cortisone injection did help some, but did not last long. Physical therapy helped with some strength, but he still has the pain and dysfunction. He has a history of ankylosing spondylitis, thus is stiff to begin with, but this is really impacting him significantly now. He would like to get the right hip replaced at this time. They have been treated conservatively in the past for the above stated problem and despite conservative measures, they continue to have progressive pain and severe functional limitations and dysfunction. They have failed non-operative management including home exercise, medications, and injections. It is felt that they would benefit from undergoing total joint  replacement. Risks and benefits of the procedure have been discussed with the patient and they elect to proceed with surgery. There are no active contraindications to surgery such as ongoing infection or rapidly progressive neurological disease.  Problem List/Past Medical Shoulder impingement syndrome (M75.40)  Arthralgia of left hip (M25.552)  S/P total hip arthroplasty (V43.64)  Left knee pain (M25.562)  Internal derangement of left knee (M23.92)  Sprain of left knee, initial encounter [id number])  Stiffness of left knee (M25.662)  Swelling of left knee joint (M25.462)  Gastroesophageal Reflux Disease  High blood pressure  Hypercholesterolemia  Prostate Disease  Sleep Apnea  Primary osteoarthritis of right hip (M16.11)  Impaired Memory  Impaired Vision  wears glasses Impaired Hearing  hearing aids Hiatal Hernia  Barrett's Esophagus  Enlarged prostate  Measles  Mumps  Bursitis   Allergies No Known Drug Allergies   Family History Diabetes Mellitus  Father, Brother. father and brother Heart Disease  grandmother fathers side and grandfather fathers side Osteoarthritis  mother and grandmother mothers side  Social History Alcohol use  current drinker; drinks beer and hard liquor; only occasionally per week Children  3 Current work status  retired Engineer, agricultural (Currently)  no Drug/Alcohol Rehab (Previously)  no Exercise  Exercises daily; does running / walking and gym / weights Illicit drug use  no Living situation  live alone Marital status  widowed Number of flights of stairs before winded  2-3 Pain Contract  no Tobacco / smoke exposure  no Tobacco use  former smoker; smoke(d) 1 1/2 pack(s) per day Advance Directives  Living Will, Healthcare POA  Medication History  Potassium Gluconate ER (595MG  Tablet ER, Oral) Active. Super B Complex Active. Atorvastatin Calcium (Oral) Specific strength unknown -  Active. Hydrochlorothiazide (25MG  Tablet, Oral) Active. Naproxen DR (375MG  Tablet DR, Oral) Active. (1 bid) Tamsulosin HCl (0.4MG  Capsule, Oral) Active. Omeprazole (40MG  Capsule DR, Oral) Active. (1 bid)  Past Surgical History  Hemorrhoidectomy  Total Hip Replacement  left   Review of Systems General Not Present- Chills, Fatigue, Fever, Memory Loss, Night Sweats, Weight Gain and Weight Loss. Skin Not Present- Eczema, Hives, Itching, Lesions and Rash. HEENT Present- Hearing Loss and Tinnitus. Not Present- Dentures, Double Vision, Headache and Visual Loss. Respiratory Not Present- Allergies, Chronic Cough, Coughing up blood, Shortness of breath at rest and Shortness of breath with exertion. Cardiovascular Not Present- Chest Pain, Difficulty Breathing Lying Down, Murmur, Palpitations, Racing/skipping heartbeats and Swelling. Gastrointestinal Not Present- Abdominal Pain, Bloody Stool, Constipation, Diarrhea, Difficulty Swallowing, Heartburn, Jaundice, Loss of appetitie, Nausea and Vomiting. Male Genitourinary Present- Urinary frequency and Urinating at Night. Not Present- Blood in Urine, Discharge, Flank Pain, Incontinence, Painful Urination, Urgency, Urinary Retention and Weak urinary stream. Musculoskeletal Present- Joint Pain and Morning Stiffness. Not Present- Back Pain, Joint Swelling, Muscle Pain, Muscle Weakness and Spasms. Neurological Not Present- Blackout spells, Difficulty with balance, Dizziness, Paralysis, Tremor and Weakness. Psychiatric Not Present- Insomnia.  Vitals  Weight: 222 lb Height: 70.5in Body Surface Area: 2.19 m Body Mass Index: 31.4 kg/m  Pulse: 76 (Regular)  BP: 162/82 (Sitting, Left Arm, Standard)   Physical Exam General Mental Status -Alert, cooperative and good historian. General Appearance-pleasant, Not in acute distress. Orientation-Oriented X3. Build & Nutrition-Well nourished and Well developed.  Head and  Neck Head-normocephalic, atraumatic . Neck Global Assessment - supple, no bruit auscultated on the right, no bruit auscultated on the left.  Eye Pupil - Bilateral-Regular and Round. Motion - Bilateral-EOMI.  Chest and Lung Exam Auscultation Breath sounds - clear at anterior chest wall and clear at posterior chest wall. Adventitious sounds - No Adventitious sounds.  Cardiovascular Auscultation Rhythm - Regular rate and rhythm. Heart Sounds - S1 WNL and S2 WNL. Murmurs & Other Heart Sounds - Auscultation of the heart reveals - No Murmurs.  Abdomen Palpation/Percussion Tenderness - Abdomen is non-tender to palpation. Rigidity (guarding) - Abdomen is soft. Auscultation Auscultation of the abdomen reveals - Bowel sounds normal.  Male Genitourinary Note: Not done, not pertinent to present illness   Musculoskeletal Note: He is in no distress. His right hip can be flexed about 90, minimal internal rotation, about 10 to 20 of external rotation, 20 abduction. He has a significantly antalgic gait pattern on his right at this time.  RADIOGRAPHS His radiographs do show bone-on-bone arthritis in that right hip with subchondral cystic formation.  Assessment & Plan Primary osteoarthritis of right hip (M16.11)  Right Total Hip Replacement - Anterior Approach  Disposition: Home with help. He requests to go straight to outpatient therapy at [facility] following his surgery. He will start on Monday March 5th. Prescription for therapy was sent.  PCP: Dr. Earle Gell - Clearance pending at time of H&P.  IV TXA  Anesthesia Issues: None  Patient was instructed on what medications to stop prior to surgery.  Signed electronically by Ok Edwards, III PA-C

## 2017-01-12 DIAGNOSIS — M25551 Pain in right hip: Secondary | ICD-10-CM | POA: Diagnosis not present

## 2017-01-12 DIAGNOSIS — Z01818 Encounter for other preprocedural examination: Secondary | ICD-10-CM | POA: Diagnosis not present

## 2017-01-13 DIAGNOSIS — M545 Low back pain: Secondary | ICD-10-CM | POA: Diagnosis not present

## 2017-01-13 DIAGNOSIS — M542 Cervicalgia: Secondary | ICD-10-CM | POA: Diagnosis not present

## 2017-01-13 DIAGNOSIS — M459 Ankylosing spondylitis of unspecified sites in spine: Secondary | ICD-10-CM | POA: Diagnosis not present

## 2017-01-13 DIAGNOSIS — M25559 Pain in unspecified hip: Secondary | ICD-10-CM | POA: Diagnosis not present

## 2017-01-15 DIAGNOSIS — I447 Left bundle-branch block, unspecified: Secondary | ICD-10-CM | POA: Diagnosis not present

## 2017-01-15 DIAGNOSIS — E7800 Pure hypercholesterolemia, unspecified: Secondary | ICD-10-CM | POA: Diagnosis not present

## 2017-01-15 DIAGNOSIS — Z01818 Encounter for other preprocedural examination: Secondary | ICD-10-CM | POA: Diagnosis not present

## 2017-01-15 DIAGNOSIS — I10 Essential (primary) hypertension: Secondary | ICD-10-CM | POA: Diagnosis not present

## 2017-01-20 DIAGNOSIS — Z136 Encounter for screening for cardiovascular disorders: Secondary | ICD-10-CM | POA: Diagnosis not present

## 2017-01-21 DIAGNOSIS — R9431 Abnormal electrocardiogram [ECG] [EKG]: Secondary | ICD-10-CM | POA: Diagnosis not present

## 2017-01-21 DIAGNOSIS — Z01818 Encounter for other preprocedural examination: Secondary | ICD-10-CM | POA: Diagnosis not present

## 2017-01-23 ENCOUNTER — Ambulatory Visit: Payer: Self-pay | Admitting: Orthopedic Surgery

## 2017-01-26 DIAGNOSIS — Z01810 Encounter for preprocedural cardiovascular examination: Secondary | ICD-10-CM | POA: Diagnosis not present

## 2017-01-26 DIAGNOSIS — I10 Essential (primary) hypertension: Secondary | ICD-10-CM | POA: Diagnosis not present

## 2017-01-26 DIAGNOSIS — R9431 Abnormal electrocardiogram [ECG] [EKG]: Secondary | ICD-10-CM | POA: Diagnosis not present

## 2017-01-27 ENCOUNTER — Ambulatory Visit: Payer: Self-pay | Admitting: Orthopedic Surgery

## 2017-01-27 ENCOUNTER — Other Ambulatory Visit (HOSPITAL_COMMUNITY): Payer: Self-pay | Admitting: *Deleted

## 2017-01-27 NOTE — Progress Notes (Signed)
CARDIAC CLEARANCE NOTE AND EKG DATED 01-15-17 [place]ON KELLEY ON CHART MEDICAL CLEARANCE DR TEMPLE 01-08-17 ON CHART

## 2017-01-27 NOTE — Patient Instructions (Signed)
[name].  01/27/2017   Your procedure is scheduled on: 02-04-17  Report to [facility]  Entrance take [facility]  elevators to 3rd floor to  [place] at 1230 pm.  Call this number if you have problems the morning of surgery [phone]   Remember: ONLY 1 PERSON MAY GO WITH YOU TO SHORT STAY TO GET  READY MORNING OF [place].  Do not eat food :After Midnight.clear liquids from midnight until 930 day of surgery, THEN NOTHING BY MOUTH AFTER 930 AM     Take these medicines the morning of surgery with A SIP OF WATER: OMEPRAZOLE (PRILOSEC), TAMSULOSIN (FLOMAX)                               You may not have any metal on your body including hair pins and              piercings  Do not wear jewelry, make-up, lotions, powders or perfumes, deodorant             Do not wear nail polish.  Do not shave  48 hours prior to surgery.              Men may shave face and neck.   Do not bring valuables to the hospital. [place].  Contacts, dentures or bridgework may not be worn into surgery.  Leave suitcase in the car. After surgery it may be brought to your room.                 Please read over the following fact sheets you were given: _____________________________________________________________________                CLEAR LIQUID DIET   Foods Allowed                                                                     Foods Excluded  Coffee and tea, regular and decaf                             liquids that you cannot  Plain Jell-O in any flavor                                             see through such as: Fruit ices (not with fruit pulp)                                     milk, soups, orange juice  Iced Popsicles                                    All solid food Carbonated beverages, regular  and diet                                    Cranberry, grape and apple juices Sports drinks like  Gatorade Lightly seasoned clear broth or consume(fat free) Sugar, honey syrup  Sample Menu Breakfast                                Lunch                                     Supper Cranberry juice                    Beef broth                            Chicken broth Jell-O                                     Grape juice                           Apple juice Coffee or tea                        Jell-O                                      Popsicle                                                Coffee or tea                        Coffee or tea  _____________________________________________________________________  [facility] - Preparing for Surgery Before surgery, you can play an important role.  Because skin is not sterile, your skin needs to be as free of germs as possible.  You can reduce the number of germs on your skin by washing with CHG (chlorahexidine gluconate) soap before surgery.  CHG is an antiseptic cleaner which kills germs and bonds with the skin to continue killing germs even after washing. Please DO NOT use if you have an allergy to CHG or antibacterial soaps.  If your skin becomes reddened/irritated stop using the CHG and inform your nurse when you arrive at Short Stay. Do not shave (including legs and underarms) for at least 48 hours prior to the first CHG shower.  You may shave your face/neck. Please follow these instructions carefully:  1.  Shower with CHG Soap the night before surgery and the  morning of Surgery.  2.  If you choose to wash your hair, wash your hair first as usual with your  normal  shampoo.  3.  After you shampoo, rinse your hair and body thoroughly to remove the  shampoo.  4.  Use CHG as you would any other liquid soap.  You can apply chg directly  to the skin and wash                       Gently with a scrungie or clean washcloth.  5.  Apply the CHG Soap to your body ONLY FROM THE NECK DOWN.   Do not use on face/ open                            Wound or open sores. Avoid contact with eyes, ears mouth and genitals (private parts).                       Wash face,  Genitals (private parts) with your normal soap.             6.  Wash thoroughly, paying special attention to the area where your surgery  will be performed.  7.  Thoroughly rinse your body with warm water from the neck down.  8.  DO NOT shower/wash with your normal soap after using and rinsing off  the CHG Soap.                9.  Pat yourself dry with a clean towel.            10.  Wear clean pajamas.            11.  Place clean sheets on your bed the night of your first shower and do not  sleep with pets. Day of Surgery : Do not apply any lotions/deodorants the morning of surgery.  Please wear clean clothes to the hospital/surgery center.  FAILURE TO FOLLOW THESE INSTRUCTIONS MAY RESULT IN THE CANCELLATION OF YOUR SURGERY PATIENT SIGNATURE_________________________________  NURSE SIGNATURE__________________________________  ________________________________________________________________________   Adam Phenix  An incentive spirometer is a tool that can help keep your lungs clear and active. This tool measures how well you are filling your lungs with each breath. Taking long deep breaths may help reverse or decrease the chance of developing breathing (pulmonary) problems (especially infection) following:  A long period of time when you are unable to move or be active. BEFORE THE PROCEDURE   If the spirometer includes an indicator to show your best effort, your nurse or respiratory therapist will set it to a desired goal.  If possible, sit up straight or lean slightly forward. Try not to slouch.  Hold the incentive spirometer in an upright position. INSTRUCTIONS FOR USE  1. Sit on the edge of your bed if possible, or sit up as far as you can in bed or on a chair. 2. Hold the incentive spirometer in an upright position. 3. Breathe out  normally. 4. Place the mouthpiece in your mouth and seal your lips tightly around it. 5. Breathe in slowly and as deeply as possible, raising the piston or the ball toward the top of the column. 6. Hold your breath for 3-5 seconds or for as long as possible. Allow the piston or ball to fall to the bottom of the column. 7. Remove the mouthpiece from your mouth and breathe out normally. 8. Rest for a few seconds and repeat Steps 1 through 7 at least 10 times every 1-2 hours when you are awake. Take your time and take a few normal breaths between deep breaths. 9. The spirometer may include an indicator to  show your best effort. Use the indicator as a goal to work toward during each repetition. 10. After each set of 10 deep breaths, practice coughing to be sure your lungs are clear. If you have an incision (the cut made at the time of surgery), support your incision when coughing by placing a pillow or rolled up towels firmly against it. Once you are able to get out of bed, walk around indoors and cough well. You may stop using the incentive spirometer when instructed by your caregiver.  RISKS AND COMPLICATIONS  Take your time so you do not get dizzy or light-headed.  If you are in pain, you may need to take or ask for pain medication before doing incentive spirometry. It is harder to take a deep breath if you are having pain. AFTER USE  Rest and breathe slowly and easily.  It can be helpful to keep track of a log of your progress. Your caregiver can provide you with a simple table to help with this. If you are using the spirometer at home, follow these instructions: [place] IF:   You are having difficultly using the spirometer.  You have trouble using the spirometer as often as instructed.  Your pain medication is not giving enough relief while using the spirometer.  You develop fever of 100.5 F (38.1 C) or higher. SEEK IMMEDIATE MEDICAL CARE IF:   You cough up bloody sputum  that had not been present before.  You develop fever of 102 F (38.9 C) or greater.  You develop worsening pain at or near the incision site. MAKE SURE YOU:   Understand these instructions.  Will watch your condition.  Will get help right away if you are not doing well or get worse. Document Released: 04/06/2007 Document Revised: 02/16/2012 Document Reviewed: 06/07/2007 Exit[employer] Patient Information 2014 [employer], [place].   ________________________________________________________________________  WHAT IS A BLOOD TRANSFUSION? Blood Transfusion Information  A transfusion is the replacement of blood or some of its parts. Blood is made up of multiple cells which provide different functions.  Red blood cells carry oxygen and are used for blood loss replacement.  White blood cells fight against infection.  Platelets control bleeding.  Plasma helps clot blood.  Other blood products are available for specialized needs, such as hemophilia or other clotting disorders. BEFORE THE TRANSFUSION  Who gives blood for transfusions?   Healthy volunteers who are fully evaluated to make sure their blood is safe. This is blood bank blood. Transfusion therapy is the safest it has ever been in the practice of medicine. Before blood is taken from a donor, a complete history is taken to make sure that person has no history of diseases nor engages in risky social behavior (examples are intravenous drug use or sexual activity with multiple partners). The donor's travel history is screened to minimize risk of transmitting infections, such as malaria. The donated blood is tested for signs of infectious diseases, such as HIV and hepatitis. The blood is then tested to be sure it is compatible with you in order to minimize the chance of a transfusion reaction. If you or a relative donates blood, this is often done in anticipation of surgery and is not appropriate for emergency situations. It takes many days to  process the donated blood. RISKS AND COMPLICATIONS Although transfusion therapy is very safe and saves many lives, the main dangers of transfusion include:   Getting an infectious disease.  Developing a transfusion reaction. This is an allergic reaction  to something in the blood you were given. Every precaution is taken to prevent this. The decision to have a blood transfusion has been considered carefully by your caregiver before blood is given. Blood is not given unless the benefits outweigh the risks. AFTER THE TRANSFUSION  Right after receiving a blood transfusion, you will usually feel much better and more energetic. This is especially true if your red blood cells have gotten low (anemic). The transfusion raises the level of the red blood cells which carry oxygen, and this usually causes an energy increase.  The nurse administering the transfusion will monitor you carefully for complications. HOME CARE INSTRUCTIONS  No special instructions are needed after a transfusion. You may find your energy is better. Speak with your caregiver about any limitations on activity for underlying diseases you may have. SEEK MEDICAL CARE IF:   Your condition is not improving after your transfusion.  You develop redness or irritation at the intravenous (IV) site. SEEK IMMEDIATE MEDICAL CARE IF:  Any of the following symptoms occur over the next 12 hours:  Shaking chills.  You have a temperature by mouth above 102 F (38.9 C), not controlled by medicine.  Chest, back, or muscle pain.  People around you feel you are not acting correctly or are confused.  Shortness of breath or difficulty breathing.  Dizziness and fainting.  You get a rash or develop hives.  You have a decrease in urine output.  Your urine turns a dark color or changes to pink, red, or brown. Any of the following symptoms occur over the next 10 days:  You have a temperature by mouth above 102 F (38.9 C), not controlled by  medicine.  Shortness of breath.  Weakness after normal activity.  The white part of the eye turns yellow (jaundice).  You have a decrease in the amount of urine or are urinating less often.  Your urine turns a dark color or changes to pink, red, or brown. Document Released: 11/21/2000 Document Revised: 02/16/2012 Document Reviewed: 07/10/2008 [facility] Patient Information 2014 [place], [place].  _______________________________________________________________________

## 2017-01-28 ENCOUNTER — Encounter (HOSPITAL_COMMUNITY): Payer: Self-pay

## 2017-01-28 ENCOUNTER — Encounter (HOSPITAL_COMMUNITY)
Admission: RE | Admit: 2017-01-28 | Discharge: 2017-01-28 | Disposition: A | Discharge status: Home / Self Care | Payer: Medicare HMO | Source: Ambulatory Visit | Attending: Orthopedic Surgery | Admitting: Orthopedic Surgery

## 2017-01-28 DIAGNOSIS — Z01812 Encounter for preprocedural laboratory examination: Secondary | ICD-10-CM | POA: Insufficient documentation

## 2017-01-28 DIAGNOSIS — M1611 Unilateral primary osteoarthritis, right hip: Secondary | ICD-10-CM | POA: Diagnosis not present

## 2017-01-28 HISTORY — DX: Abrasion of nose, initial encounter: S00.31XA

## 2017-01-28 HISTORY — DX: Unspecified osteoarthritis, unspecified site: M19.90

## 2017-01-28 HISTORY — DX: Other complications of anesthesia, initial encounter: T88.59XA

## 2017-01-28 HISTORY — DX: Ankylosing spondylitis of unspecified sites in spine: M45.9

## 2017-01-28 HISTORY — DX: Adverse effect of unspecified anesthetic, initial encounter: T41.45XA

## 2017-01-28 LAB — SURGICAL PCR SCREEN
MRSA, PCR: NEGATIVE
Staphylococcus aureus: POSITIVE — AB

## 2017-01-28 LAB — CBC
HCT: 46 % (ref 39.0–52.0)
Hemoglobin: 16.3 g/dL (ref 13.0–17.0)
MCH: 33.7 pg (ref 26.0–34.0)
MCHC: 35.4 g/dL (ref 30.0–36.0)
MCV: 95.2 fL (ref 78.0–100.0)
PLATELETS: 251 10*3/uL (ref 150–400)
RBC: 4.83 MIL/uL (ref 4.22–5.81)
RDW: 12 % (ref 11.5–15.5)
WBC: 9 10*3/uL (ref 4.0–10.5)

## 2017-01-28 LAB — COMPREHENSIVE METABOLIC PANEL
ALT: 25 U/L (ref 17–63)
AST: 26 U/L (ref 15–41)
Albumin: 4.4 g/dL (ref 3.5–5.0)
Alkaline Phosphatase: 57 U/L (ref 38–126)
Anion gap: 7 (ref 5–15)
BUN: 16 mg/dL (ref 6–20)
CO2: 30 mmol/L (ref 22–32)
CREATININE: 0.93 mg/dL (ref 0.61–1.24)
Calcium: 9.4 mg/dL (ref 8.9–10.3)
Chloride: 102 mmol/L (ref 101–111)
GFR calc non Af Amer: >60 mL/min (ref >60)
Glucose, Bld: 111 mg/dL — ABNORMAL HIGH (ref 65–99)
Potassium: 4 mmol/L (ref 3.5–5.1)
SODIUM: 139 mmol/L (ref 135–145)
Total Bilirubin: 1 mg/dL (ref 0.3–1.2)
Total Protein: 7.4 g/dL (ref 6.5–8.1)

## 2017-01-28 LAB — PROTIME-INR
INR: 1.06
Prothrombin Time: 13.8 seconds (ref 11.4–15.2)

## 2017-01-28 LAB — APTT: APTT: 33 s (ref 24–36)

## 2017-01-28 NOTE — Progress Notes (Signed)
LOV DR BARNES 12-12-16 ON CHART EKG 01-12-17 EKG DR BARNES ON CHART

## 2017-01-30 DIAGNOSIS — I10 Essential (primary) hypertension: Secondary | ICD-10-CM | POA: Diagnosis not present

## 2017-01-30 DIAGNOSIS — I714 Abdominal aortic aneurysm, without rupture: Secondary | ICD-10-CM | POA: Diagnosis not present

## 2017-01-30 DIAGNOSIS — I447 Left bundle-branch block, unspecified: Secondary | ICD-10-CM | POA: Diagnosis not present

## 2017-01-30 DIAGNOSIS — Z01818 Encounter for other preprocedural examination: Secondary | ICD-10-CM | POA: Diagnosis not present

## 2017-02-02 DIAGNOSIS — I447 Left bundle-branch block, unspecified: Secondary | ICD-10-CM | POA: Diagnosis present

## 2017-02-02 DIAGNOSIS — R9439 Abnormal result of other cardiovascular function study: Secondary | ICD-10-CM | POA: Diagnosis present

## 2017-02-02 NOTE — H&P (Signed)
OFFICE VISIT NOTES COPIED TO EPIC FOR DOCUMENTATION  . History of Present Illness Laverda Page MD; 02-03-2017 5:16 PM) Patient words: Last OV 01/15/2017; FU nuc, echo, abdn duplex.  The patient is a 83 year old male who presents for a follow-up for Abnormal EKG. Patient presents for pre-operative clearance. He is scheduled for right total hip athroplasty with Dr. Wynelle Link on 02/04/2017. He has previously had a left total hip arthroplasty performed in 2011. Past medical history includes hypertension, impaired fasting glucose, and hypercholesterolemia. He was fairly active with playing golf until here recently due to pain in the hip.   Due to preoperative EKG revealing left bundle branch block he was referred to me. He underwent nuclear stress testing and echocardiogram in our office in February 2018 revealing high risk anterolateral ischemia with low EF by stress test and EF of 45% by echocardiogram. Abdominal aortic duplex revealed small abdominal aortic aneurysm.   He is accompanied his daughter at the bedside. No specific complaints. He denies any chest pain, shortness of breath, edema, palpitations, claudication, or symptoms of TIA. He does have a previous smoking history.   Problem List/Past Medical (April Garrison; February 03, 2017 10:50 AM) Benign essential hypertension (I10)  Hyperlipidemia, group A (E78.00)  OSA (obstructive sleep apnea) (G47.33)  LBBB (left bundle branch block) (I44.7)  Lexiscan myoview stress test 01/26/2017: 1. The resting electrocardiogram demonstrated normal sinus rhythm, LBBB and no resting arrhythmias. Stress EKG is non-diagnostic for ischemia as it a pharmacologic stress using Lexiscan and LBBB. Stress symptoms included chest discomfort. 2. The left ventricle is moderately dilated in both rest and stress images with LV end-diastolic volume of 811 mL. The t.i.d. index was 0.92. The perfusion images reveal moderate to large size anterior, anteroapical and  anteroseptal thinning with decreased counts suggestive of prior myocardial infarction. Stress images reveal mild to moderate amount of reversibility. Left ventricular systolic function calculated by QGS was markedly depressed at 35% with global hypokinesis. This is a high risk study, consider further cardiac work-up. Echocardiogram 01/21/2017: Left ventricle cavity is normal in size. Moderate concentric hypertrophy of the left ventricle. Abnormal septal wall motion due to left bundle branch block. Mild antero-septal hypokinesis. Low normal LVEF at 45-50%. Calculated EF 45%. Left atrial cavity is moderate to severely dilated at 4.9 cm. Mild (Grade I) mitral regurgitation. Trace tricuspid regurgitation. Unable to estimate PA pressure due to absence/minimal TR signal. Preoperative clearance (Z01.818)  right total hip athroplasty with Dr. Wynelle Link on 02/04/2017 Screening for AAA (abdominal aortic aneurysm) (Z13.6)  Abdominal aortic duplex 01/20/2017: Moderate dilatation of the abdominal aorta is noted in the mid and distal aorta. An abdominal aortic aneurysm measuring 3.02 x 3.02 x 2.9 cm is seen. Diffuse plaque noted in the proximal, mid and distal aorta. Enlarged inferior vena cava with normal respiratory variation. May suggest elevated central venous pressure. Elevated fasting glucose (R73.01)  Labs 09/06/2016: A1c 6.2%, serum glucose 113 mg, BUN 18, creatinine 0.94, CMP otherwise normal. Total cholesterol 113, triglycerides 103, HDL 37, LDL 55. Non-HDL cholesterol 76. Ankylosing spondylitis (M45.9)  GERD (gastroesophageal reflux disease) (K21.9)  Osteoarthritis (M19.90)   Allergies (April Garrison; 2017-02-03 10:50 AM) No Known Drug Allergies 01/15/2017  Family History (April Garrison; 2017-02-03 10:50 AM) Mother  Deceased. at age 13 from chronic flu/ natural causes: arthirits: no known cardiovascular conditions Father  Deceased. at age 63: mini CVA's; first around age 72,HTN Sister 1  Deceased.  at age 80 from cervical cancer: no known cardiovascular conditions Brother 2  1 younger  DM (overnight),no known cardiovascular conditions, dementia 1 older DM(overweight), no known cardiovascular conditions  Social History (April Garrison; 01/30/2017 10:50 AM) Current tobacco use  Former smoker. quit 1999 Alcohol Use  Occasional alcohol use. Marital status  Widowed. Living Situation  Lives alone. Number of Children  3.  Past Surgical History (April Garrison; 01/30/2017 10:50 AM) Hernia Repair 1998 Bariatric Surgery 2002 Total Hip Replacement - Right 2011  Medication History (April Garrison; 01/30/2017 11:01 AM) Losartan Potassium-HCTZ (50-12.5MG Tablet, 1 (one) Tablet Oral daily, Taken starting 01/15/2017) Active. Atorvastatin Calcium (40MG Tablet, 1 Oral daily) Active. Tamsulosin HCl (0.4MG Capsule, 1 Oral twice a day) Active. Omeprazole (40MG Capsule DR, 1 Oral twice a day) Active. Meloxicam (7.5MG Tablet, 1 Oral daily) Active. (Currently holding until after surgery on 02/04/17) Magnesium (500MG Tablet, 1 Oral daily) Discontinued: patient Choice. Tylenol PM Extra Strength (500-25MG Tablet, 1 Oral at bedtime as needed) Active. Potassium (99MG Tablet, 1 Oral at bedtime) Active. Multiple Vitamin (1 (one) Oral daily) Active. (Currently holding until after surgery on 02/04/17) Medications Reconciled (Pt brought list)  Diagnostic Studies History Laverda Page, MD; 01/30/2017 5:06 PM) Echocardiogram 01/21/2017 Left ventricle cavity is normal in size. Moderate concentric hypertrophy of the left ventricle. Abnormal septal wall motion due to left bundle branch block. Mild antero-septal hypokinesis. Low normal LVEF at 45-50%. Calculated EF 45%. Left atrial cavity is moderate to severely dilated at 4.9 cm. Mild (Grade I) mitral regurgitation. Trace tricuspid regurgitation. Unable to estimate PA pressure due to absence/minimal TR signal. Nuclear stress test 01/26/2017 1. The  resting electrocardiogram demonstrated normal sinus rhythm, LBBB and no resting arrhythmias. Stress EKG is non-diagnostic for ischemia as it a pharmacologic stress using Lexiscan and LBBB. Stress symptoms included chest discomfort. 2. The left ventricle is moderately dilated in both rest and stress images with LV end-diastolic volume of 683 mL. The t.i.d. index was 0.92. The perfusion images reveal moderate to large size anterior, anteroapical and anteroseptal thinning with decreased counts suggestive of prior myocardial infarction. Stress images reveal mild to moderate amount of reversibility. Left ventricular systolic function calculated by QGS was markedly depressed at 35% with global hypokinesis. This is a high risk study, consider further cardiac work-up. Abdominal Ultrasound 01/20/2017 Moderate dilatation of the abdominal aorta is noted in the mid and distal aorta. An abdominal aortic aneurysm measuring 3.02 x 3.02 x 2.9 cm is seen. Diffuse plaque noted in the proximal, mid and distal aorta. Enlarged inferior vena cava with normal respiratory variation. May suggest elevated central venous pressure. Labwork  Labs 01/27/2017: Serum glucose 106 mg, nonfasting. BUN 15, creatinine 0.90, eGFR greater than 60 well. Sodium 133, potassium 4.3. HB 17.0/HCT 47.5, minimal macrocytosis present, pro time normal.    Review of Systems Laverda Page, MD; 01/30/2017 5:16 PM) General Not Present- Appetite Loss and Weight Gain. Respiratory Not Present- Chronic Cough and Wakes up from Sleep Wheezing or Short of Breath. Cardiovascular Not Present- Chest Pain, Difficulty Breathing On Exertion, Edema, Leg Pain and/or Swelling and Shortness of Breath. Gastrointestinal Not Present- Black, Tarry Stool and Difficulty Swallowing. Musculoskeletal Not Present- Decreased Range of Motion and Muscle Atrophy. Neurological Not Present- Attention Deficit and Syncope. Psychiatric Not Present- Personality Changes and Suicidal  Ideation. Endocrine Not Present- Cold Intolerance and Heat Intolerance. Hematology Not Present- Abnormal Bleeding. All other systems negative  Vitals (April Garrison; 01/30/2017 11:03 AM) 01/30/2017 10:55 AM Weight: 226.5 lb Height: 71in Body Surface Area: 2.22 m Body Mass Index: 31.59 kg/m  Pulse: 75 (Regular)  P.OX: 97% (  Room air) BP: 154/80 (Sitting, Left Arm, Standard)       Physical Exam Laverda Page, MD; 01/30/2017 5:16 PM) General Mental Status-Alert. General Appearance-Cooperative, Appears stated age, Not in acute distress. Build & Nutrition-Well built and Morbidly obese.  Head and Neck Neck -Note: Short neck and difficult to evaluate JVD.  Thyroid Gland Characteristics - no palpable nodules, no palpable enlargement.  Cardiovascular Inspection Jugular vein - Right - No Distention. Auscultation Heart Sounds - S1 WNL and S2 Paradoxical splitting.  Abdomen Inspection Contour - Obese and Pannus present. Palpation/Percussion Palpation and Percussion of the abdomen reveal - Non Tender and No hepatosplenomegaly. Note: ventral hernia present. reducable. Auscultation Normal exam - Bowel sounds normal.  Peripheral Vascular Lower Extremity Inspection - Bilateral - Inspection Normal. Palpation - Edema - Bilateral - No edema. Femoral pulse - Bilateral - Feeble(Pulsus difficult to feel due to patient's bodily habitus.), No Bruits. Popliteal pulse - Bilateral - Feeble(Pulsus difficult to feel due to patient's bodily habitus.). Dorsalis pedis pulse - Bilateral - Normal. Posterior tibial pulse - Bilateral - Normal. Carotid arteries - Bilateral-No Carotid bruit.  Neurologic Neurologic evaluation reveals -alert and oriented x 3 with no impairment of recent or remote memory. Motor-Grossly intact without any focal deficits.  Musculoskeletal Global Assessment Left Lower Extremity - normal range of motion without pain. Right Lower Extremity -  normal range of motion without pain.    Assessment & Plan Laverda Page MD; 01/30/2017 5:16 PM) Preoperative clearance [phone]) Story: right total hip athroplasty with Dr. Wynelle Link on 02/04/2017 Current Plans METABOLIC PANEL, BASIC (94585) CBC & PLATELETS (AUTO) (85027) PT (PROTHROMBIN TIME) (92924) LBBB (left bundle branch block) (I44.7) Story: Lexiscan myoview stress test 01/26/2017: 1. The resting electrocardiogram demonstrated normal sinus rhythm, LBBB and no resting arrhythmias. Stress EKG is non-diagnostic for ischemia as it a pharmacologic stress using Lexiscan and LBBB. Stress symptoms included chest discomfort. 2. The left ventricle is moderately dilated in both rest and stress images with LV end-diastolic volume of 462 mL. The t.i.d. index was 0.92. The perfusion images reveal moderate to large size anterior, anteroapical and anteroseptal thinning with decreased counts suggestive of prior myocardial infarction. Stress images reveal mild to moderate amount of reversibility. Left ventricular systolic function calculated by QGS was markedly depressed at 35% with global hypokinesis. This is a high risk study, consider further cardiac work-up.  Echocardiogram 01/21/2017: Left ventricle cavity is normal in size. Moderate concentric hypertrophy of the left ventricle. Abnormal septal wall motion due to left bundle branch block. Mild antero-septal hypokinesis. Low normal LVEF at 45-50%. Calculated EF 45%. Left atrial cavity is moderate to severely dilated at 4.9 cm. Mild (Grade I) mitral regurgitation. Trace tricuspid regurgitation. Unable to estimate PA pressure due to absence/minimal TR signal. Impression: EKG 01/15/2017: Normal sinus rhythm at 77 bpm with first degree AV block, normal axis, LBBB, no further analysis due to LBBB. Benign essential hypertension (I10) Current Plans Changed Losartan Potassium-HCTZ 100-12.5MG, 1 (one) Tablet daily, #30, 30 days starting 01/30/2017, Ref.  x1. Discontinued Potassium 99MG (AIncreased Losartan). Hyperlipidemia, group A (E78.00) Abdominal aortic aneurysm (AAA) without rupture (I71.4) Story: Abdominal aortic duplex 01/20/2017: Moderate dilatation of the abdominal aorta is noted in the mid and distal aorta. An abdominal aortic aneurysm measuring 3.02 x 3.02 x 2.9 cm is seen. Diffuse plaque noted in the proximal, mid and distal aorta. Enlarged inferior vena cava with normal respiratory variation. May suggest elevated central venous pressure. Recheck in 2 years Labwork Story: Labs 01/27/2017: Serum glucose 106  mg, nonfasting. BUN 15, creatinine 0.90, eGFR greater than 60 well. Sodium 133, potassium 4.3. HB 17.0/HCT 47.5, minimal macrocytosis present, pro time normal. Current Plans Mechanism of underlying disease process and action of medications discussed with the patient. I discussed primary/secondary prevention and also dietary counceling was done. I discussed with the patient and his daughter at the bedside regarding the high risk abnormal stress test revealing anterior ischemia. The changes may be related to left bundle branch block there is no way of completely excluding significant CAD and hence I recommended proceeding with coronary angiography. CT angiogram at this age may be false positive or false negative. He is aware that I'll perform angioplasty only in the event he has significant major vessel disease as he is awaiting surgery due to severe degenerative disease. His surgery will be postponed he is aware that he will need to be on dual antiplatelet therapy if he indeed needs coronary angioplasty and stenting.  I also discussed regarding abdominal aortic aneurysm, it is very small we will recheck abdominal aortic duplex in 2 years.  His BP has improved being on losartan HCT, will change it to losartan 100/12.5 mg in the morning. I have reviewed the results of the recently performed labs from this morning and they're stable to  proceed with coronary angiography. Schedule for cardiac catheterization, and possible angioplasty. We discussed regarding risks, benefits, alternatives to this including stress testing, CTA and continued medical therapy. Patient wants to proceed. Understands <1-2% risk of death, stroke, MI, urgent CABG, bleeding, infection, renal failure but not limited to these. This was a greater than 40 minute office visit with greater than 50% of the time spent with face-to-face encounter with patient and evaluation of complex medical issues, review of external records and coordination of care.  CC: Dr. Leighton Ruff, Dr. Gaynelle Arabian  Signed by Laverda Page, MD (01/30/2017 5:17 PM)

## 2017-02-03 ENCOUNTER — Ambulatory Visit (HOSPITAL_COMMUNITY)
Admission: RE | Admit: 2017-02-03 | Discharge: 2017-02-03 | Disposition: A | Discharge status: Home / Self Care | Payer: Medicare HMO | Source: Ambulatory Visit | Attending: Cardiology | Admitting: Cardiology

## 2017-02-03 ENCOUNTER — Encounter (HOSPITAL_COMMUNITY)
Admission: RE | Disposition: A | Discharge status: Home / Self Care | Payer: Self-pay | Source: Ambulatory Visit | Attending: Cardiology

## 2017-02-03 ENCOUNTER — Ambulatory Visit: Payer: Self-pay | Admitting: Orthopedic Surgery

## 2017-02-03 DIAGNOSIS — R7301 Impaired fasting glucose: Secondary | ICD-10-CM | POA: Insufficient documentation

## 2017-02-03 DIAGNOSIS — Z8249 Family history of ischemic heart disease and other diseases of the circulatory system: Secondary | ICD-10-CM

## 2017-02-03 DIAGNOSIS — Z823 Family history of stroke: Secondary | ICD-10-CM | POA: Insufficient documentation

## 2017-02-03 DIAGNOSIS — Z833 Family history of diabetes mellitus: Secondary | ICD-10-CM | POA: Insufficient documentation

## 2017-02-03 DIAGNOSIS — M25551 Pain in right hip: Secondary | ICD-10-CM | POA: Diagnosis present

## 2017-02-03 DIAGNOSIS — K219 Gastro-esophageal reflux disease without esophagitis: Secondary | ICD-10-CM | POA: Diagnosis present

## 2017-02-03 DIAGNOSIS — Z01810 Encounter for preprocedural cardiovascular examination: Secondary | ICD-10-CM

## 2017-02-03 DIAGNOSIS — I447 Left bundle-branch block, unspecified: Secondary | ICD-10-CM | POA: Insufficient documentation

## 2017-02-03 DIAGNOSIS — Z96642 Presence of left artificial hip joint: Secondary | ICD-10-CM | POA: Diagnosis present

## 2017-02-03 DIAGNOSIS — E7800 Pure hypercholesterolemia, unspecified: Secondary | ICD-10-CM

## 2017-02-03 DIAGNOSIS — I714 Abdominal aortic aneurysm, without rupture: Secondary | ICD-10-CM | POA: Insufficient documentation

## 2017-02-03 DIAGNOSIS — Z87891 Personal history of nicotine dependence: Secondary | ICD-10-CM

## 2017-02-03 DIAGNOSIS — M459 Ankylosing spondylitis of unspecified sites in spine: Secondary | ICD-10-CM

## 2017-02-03 DIAGNOSIS — N400 Enlarged prostate without lower urinary tract symptoms: Secondary | ICD-10-CM | POA: Diagnosis present

## 2017-02-03 DIAGNOSIS — I2510 Atherosclerotic heart disease of native coronary artery without angina pectoris: Secondary | ICD-10-CM

## 2017-02-03 DIAGNOSIS — Z6831 Body mass index (BMI) 31.0-31.9, adult: Secondary | ICD-10-CM | POA: Insufficient documentation

## 2017-02-03 DIAGNOSIS — R9431 Abnormal electrocardiogram [ECG] [EKG]: Secondary | ICD-10-CM | POA: Diagnosis not present

## 2017-02-03 DIAGNOSIS — Z9884 Bariatric surgery status: Secondary | ICD-10-CM | POA: Insufficient documentation

## 2017-02-03 DIAGNOSIS — M1611 Unilateral primary osteoarthritis, right hip: Secondary | ICD-10-CM | POA: Diagnosis present

## 2017-02-03 DIAGNOSIS — M169 Osteoarthritis of hip, unspecified: Secondary | ICD-10-CM | POA: Diagnosis not present

## 2017-02-03 DIAGNOSIS — R9439 Abnormal result of other cardiovascular function study: Secondary | ICD-10-CM | POA: Insufficient documentation

## 2017-02-03 DIAGNOSIS — G4733 Obstructive sleep apnea (adult) (pediatric): Secondary | ICD-10-CM

## 2017-02-03 DIAGNOSIS — Z96641 Presence of right artificial hip joint: Secondary | ICD-10-CM | POA: Diagnosis not present

## 2017-02-03 DIAGNOSIS — M1990 Unspecified osteoarthritis, unspecified site: Secondary | ICD-10-CM

## 2017-02-03 DIAGNOSIS — I10 Essential (primary) hypertension: Secondary | ICD-10-CM | POA: Diagnosis present

## 2017-02-03 DIAGNOSIS — Z8261 Family history of arthritis: Secondary | ICD-10-CM | POA: Diagnosis not present

## 2017-02-03 DIAGNOSIS — K2270 Barrett's esophagus without dysplasia: Secondary | ICD-10-CM | POA: Diagnosis not present

## 2017-02-03 DIAGNOSIS — D649 Anemia, unspecified: Secondary | ICD-10-CM | POA: Diagnosis present

## 2017-02-03 DIAGNOSIS — Z471 Aftercare following joint replacement surgery: Secondary | ICD-10-CM | POA: Diagnosis not present

## 2017-02-03 DIAGNOSIS — E785 Hyperlipidemia, unspecified: Secondary | ICD-10-CM | POA: Diagnosis present

## 2017-02-03 DIAGNOSIS — Z791 Long term (current) use of non-steroidal anti-inflammatories (NSAID): Secondary | ICD-10-CM | POA: Diagnosis not present

## 2017-02-03 HISTORY — PX: LEFT HEART CATH AND CORONARY ANGIOGRAPHY: CATH118249

## 2017-02-03 SURGERY — LEFT HEART CATH AND CORONARY ANGIOGRAPHY
Anesthesia: LOCAL

## 2017-02-03 MED ORDER — ASPIRIN 81 MG PO CHEW
81.0000 mg | CHEWABLE_TABLET | ORAL | Status: AC
  Administered 2017-02-03: 81 mg via ORAL

## 2017-02-03 MED ORDER — SODIUM CHLORIDE 0.9% FLUSH: 3.0000 mL | INTRAVENOUS | Status: DC | PRN

## 2017-02-03 MED ORDER — MIDAZOLAM HCL 2 MG/2ML IJ SOLN
INTRAMUSCULAR | Status: DC | PRN
  Administered 2017-02-03: 2 mg via INTRAVENOUS

## 2017-02-03 MED ORDER — HYDROMORPHONE HCL 1 MG/ML IJ SOLN
INTRAMUSCULAR | Status: DC | PRN
  Administered 2017-02-03 (×3): 0.5 mg via INTRAVENOUS

## 2017-02-03 MED ORDER — MIDAZOLAM HCL 2 MG/2ML IJ SOLN
INTRAMUSCULAR | Status: AC
  Filled 2017-02-03: qty 2

## 2017-02-03 MED ORDER — HYDROMORPHONE HCL 1 MG/ML IJ SOLN
INTRAMUSCULAR | Status: AC
  Filled 2017-02-03: qty 1

## 2017-02-03 MED ORDER — IOPAMIDOL (ISOVUE-370) INJECTION 76%
INTRAVENOUS | Status: DC | PRN
  Administered 2017-02-03: 100 mL via INTRA_ARTERIAL

## 2017-02-03 MED ORDER — HEPARIN (PORCINE) IN NACL 2-0.9 UNIT/ML-% IJ SOLN
INTRAMUSCULAR | Status: AC
  Filled 2017-02-03: qty 1000

## 2017-02-03 MED ORDER — VERAPAMIL HCL 2.5 MG/ML IV SOLN
INTRAVENOUS | Status: AC
  Filled 2017-02-03: qty 2

## 2017-02-03 MED ORDER — SODIUM CHLORIDE 0.9 % WEIGHT BASED INFUSION: 1.0000 mL/kg/h | INTRAVENOUS | Status: DC

## 2017-02-03 MED ORDER — HEPARIN (PORCINE) IN NACL 2-0.9 UNIT/ML-% IJ SOLN
INTRAMUSCULAR | Status: DC | PRN
Start: 2017-02-03 — End: 2017-02-03
  Administered 2017-02-03: 1000 mL

## 2017-02-03 MED ORDER — HEPARIN SODIUM (PORCINE) 1000 UNIT/ML IJ SOLN
INTRAMUSCULAR | Status: AC
  Filled 2017-02-03: qty 1

## 2017-02-03 MED ORDER — VERAPAMIL HCL 2.5 MG/ML IV SOLN
INTRA_ARTERIAL | Status: DC | PRN
  Administered 2017-02-03: 15 mL via INTRA_ARTERIAL
  Administered 2017-02-03: 5 mL via INTRA_ARTERIAL

## 2017-02-03 MED ORDER — SODIUM CHLORIDE 0.9 % IV SOLN
250.0000 mL | INTRAVENOUS | Status: DC | PRN
  Administered 2017-02-03: 250 mL via INTRAVENOUS

## 2017-02-03 MED ORDER — SODIUM CHLORIDE 0.9 % WEIGHT BASED INFUSION
3.0000 mL/kg/h | INTRAVENOUS | Status: AC
  Administered 2017-02-03: 3 mL/kg/h via INTRAVENOUS

## 2017-02-03 MED ORDER — ASPIRIN 81 MG PO CHEW
CHEWABLE_TABLET | ORAL | Status: AC
  Administered 2017-02-03: 81 mg via ORAL
  Filled 2017-02-03: qty 1

## 2017-02-03 MED ORDER — IOPAMIDOL (ISOVUE-370) INJECTION 76%
INTRAVENOUS | Status: AC
  Filled 2017-02-03: qty 100

## 2017-02-03 MED ORDER — SODIUM CHLORIDE 0.9% FLUSH: 3.0000 mL | Freq: Two times a day (BID) | INTRAVENOUS | Status: DC

## 2017-02-03 MED ORDER — LIDOCAINE HCL (PF) 1 % IJ SOLN
INTRAMUSCULAR | Status: AC
  Filled 2017-02-03: qty 30

## 2017-02-03 MED ORDER — LIDOCAINE HCL (PF) 1 % IJ SOLN
INTRAMUSCULAR | Status: DC | PRN
  Administered 2017-02-03: 2 mL

## 2017-02-03 SURGICAL SUPPLY — 11 items
CATH EXPO 5F FL3.5 (CATHETERS) ×1 IMPLANT
CATH INFINITI 5FR AL1 (CATHETERS) ×1 IMPLANT
CATH LAUNCHER 5F EBU3.5 (CATHETERS) ×1 IMPLANT
DEVICE RAD COMP TR BAND LRG (VASCULAR PRODUCTS) ×1 IMPLANT
GLIDESHEATH SLEND A-KIT 6F 20G (SHEATH) ×1 IMPLANT
GUIDEWIRE INQWIRE 1.5J.035X260 (WIRE) IMPLANT
INQWIRE 1.5J .035X260CM (WIRE) ×2
KIT HEART LEFT (KITS) ×2 IMPLANT
PACK CARDIAC CATHETERIZATION (CUSTOM PROCEDURE TRAY) ×2 IMPLANT
TRANSDUCER W/STOPCOCK (MISCELLANEOUS) ×2 IMPLANT
TUBING CIL FLEX 10 FLL-RA (TUBING) ×2 IMPLANT

## 2017-02-03 NOTE — Interval H&P Note (Signed)
History and Physical Interval Note:  02/03/2017 12:45 PM  [name].  has presented today for surgery, with the diagnosis of abnormal stress test  The various methods of treatment have been discussed with the patient and family. After consideration of risks, benefits and other options for treatment, the patient has consented to  Procedure(s): Left Heart Cath and Coronary Angiography (N/A) and possible PCI as a surgical intervention .  The patient's history has been reviewed, patient examined, no change in status, stable for surgery.  I have reviewed the patient's chart and labs.  Questions were answered to the patient's satisfaction.     Adrian Prows

## 2017-02-03 NOTE — Progress Notes (Signed)
Called [name] to notify to arrive to Short Stay at 0915. NPO after midnight. Spoke with daughter and she verbalized understanding. He had cardiac cath performed today. Daughter stated he was instructed to refrain from using a walker for 5 days. She said Dr. Nadyne Coombes and Dr. Wynelle Link have talked since procedure.

## 2017-02-03 NOTE — Progress Notes (Signed)
LEFT HEART CATH 02-03-17 EPIC

## 2017-02-03 NOTE — H&P (Signed)
[name] DOB: [birth year] Married / Language: English / Race: White Male Date of Admission:  02/04/2017 CC:  Right hip pain and stiffness History of Present Illness  The patient is a 83 year old male who comes in for a preoperative History and Physical. The patient is scheduled for a right total hip arthroplasty (anterior) to be performed by Dr. Dione Plover. Aluisio, MD at [facility] on 02-04-2017. The patient is a 83 year old male who presents today for follow up of their hip. The patient is being followed for their right hip pain and osteoarthritis. Symptoms reported include: pain. The patient feels that they are worse with time and report their pain level to be mild to moderate. Current treatment includes: activity modification. The patient has reported improvement of their symptoms with: Cortisone injections and physical therapy. He is feeling some better from the cortisone injection and from the physical therapy but not as much as he would like. He is worried that he is going to become more and more limited in his activity. He is considering total hip replacement at this point. Unfortunately, his right hip is getting progressively worse over time. It is definitely starting to limit what he can and cannot do. Cortisone injection did help some, but did not last long. Physical therapy helped with some strength, but he still has the pain and dysfunction. He has a history of ankylosing spondylitis, thus is stiff to begin with, but this is really impacting him significantly now. He would like to get the right hip replaced at this time. They have been treated conservatively in the past for the above stated problem and despite conservative measures, they continue to have progressive pain and severe functional limitations and dysfunction. They have failed non-operative management including home exercise, medications, and injections. It is felt that they would benefit from undergoing total joint  replacement. Risks and benefits of the procedure have been discussed with the patient and they elect to proceed with surgery. There are no active contraindications to surgery such as ongoing infection or rapidly progressive neurological disease.  Problem List/Past Medical Shoulder impingement syndrome (M75.40)  Arthralgia of left hip (M25.552)  S/P total hip arthroplasty (V43.64)  Left knee pain (M25.562)  Internal derangement of left knee (M23.92)  Sprain of left knee, initial encounter [id number])  Stiffness of left knee (M25.662)  Swelling of left knee joint (M25.462)  Gastroesophageal Reflux Disease  High blood pressure  Hypercholesterolemia  Prostate Disease  Sleep Apnea  Primary osteoarthritis of right hip (M16.11)  Impaired Memory  Impaired Vision  wears glasses Impaired Hearing  hearing aids Hiatal Hernia  Barrett's Esophagus  Enlarged prostate  Measles  Mumps  Bursitis   Allergies No Known Drug Allergies   Family History Diabetes Mellitus  Father, Brother. father and brother Heart Disease  grandmother fathers side and grandfather fathers side Osteoarthritis  mother and grandmother mothers side  Social History Alcohol use  current drinker; drinks beer and hard liquor; only occasionally per week Children  3 Current work status  retired Engineer, agricultural (Currently)  no Drug/Alcohol Rehab (Previously)  no Exercise  Exercises daily; does running / walking and gym / weights Illicit drug use  no Living situation  live alone Marital status  widowed Number of flights of stairs before winded  2-3 Pain Contract  no Tobacco / smoke exposure  no Tobacco use  former smoker; smoke(d) 1 1/2 pack(s) per day Advance Directives  Living Will, Healthcare POA  Medication History  Potassium Gluconate ER (595MG  Tablet ER, Oral) Active. Super B Complex Active. Atorvastatin Calcium (Oral) Specific strength unknown -  Active. Hydrochlorothiazide (25MG  Tablet, Oral) Active. Naproxen DR (375MG  Tablet DR, Oral) Active. (1 bid) Tamsulosin HCl (0.4MG  Capsule, Oral) Active. Omeprazole (40MG  Capsule DR, Oral) Active. (1 bid)  Past Surgical History  Hemorrhoidectomy  Total Hip Replacement  left   Review of Systems General Not Present- Chills, Fatigue, Fever, Memory Loss, Night Sweats, Weight Gain and Weight Loss. Skin Not Present- Eczema, Hives, Itching, Lesions and Rash. HEENT Present- Hearing Loss and Tinnitus. Not Present- Dentures, Double Vision, Headache and Visual Loss. Respiratory Not Present- Allergies, Chronic Cough, Coughing up blood, Shortness of breath at rest and Shortness of breath with exertion. Cardiovascular Not Present- Chest Pain, Difficulty Breathing Lying Down, Murmur, Palpitations, Racing/skipping heartbeats and Swelling. Gastrointestinal Not Present- Abdominal Pain, Bloody Stool, Constipation, Diarrhea, Difficulty Swallowing, Heartburn, Jaundice, Loss of appetitie, Nausea and Vomiting. Male Genitourinary Present- Urinary frequency and Urinating at Night. Not Present- Blood in Urine, Discharge, Flank Pain, Incontinence, Painful Urination, Urgency, Urinary Retention and Weak urinary stream. Musculoskeletal Present- Joint Pain and Morning Stiffness. Not Present- Back Pain, Joint Swelling, Muscle Pain, Muscle Weakness and Spasms. Neurological Not Present- Blackout spells, Difficulty with balance, Dizziness, Paralysis, Tremor and Weakness. Psychiatric Not Present- Insomnia.  Vitals  Weight: 222 lb Height: 70.5in Body Surface Area: 2.19 m Body Mass Index: 31.4 kg/m  Pulse: 76 (Regular)  BP: 162/82 (Sitting, Left Arm, Standard)   Physical Exam General Mental Status -Alert, cooperative and good historian. General Appearance-pleasant, Not in acute distress. Orientation-Oriented X3. Build & Nutrition-Well nourished and Well developed.  Head and  Neck Head-normocephalic, atraumatic . Neck Global Assessment - supple, no bruit auscultated on the right, no bruit auscultated on the left.  Eye Pupil - Bilateral-Regular and Round. Motion - Bilateral-EOMI.  Chest and Lung Exam Auscultation Breath sounds - clear at anterior chest wall and clear at posterior chest wall. Adventitious sounds - No Adventitious sounds.  Cardiovascular Auscultation Rhythm - Regular rate and rhythm. Heart Sounds - S1 WNL and S2 WNL. Murmurs & Other Heart Sounds - Auscultation of the heart reveals - No Murmurs.  Abdomen Palpation/Percussion Tenderness - Abdomen is non-tender to palpation. Rigidity (guarding) - Abdomen is soft. Auscultation Auscultation of the abdomen reveals - Bowel sounds normal.  Male Genitourinary Note: Not done, not pertinent to present illness   Musculoskeletal Note: He is in no distress. His right hip can be flexed about 90, minimal internal rotation, about 10 to 20 of external rotation, 20 abduction. He has a significantly antalgic gait pattern on his right at this time.  RADIOGRAPHS His radiographs do show bone-on-bone arthritis in that right hip with subchondral cystic formation.  Assessment & Plan Primary osteoarthritis of right hip (M16.11)  Right Total Hip Replacement - Anterior Approach  Disposition: Home with help. He requests to go straight to outpatient therapy at [facility] following his surgery. He will start on Monday March 5th. Prescription for therapy was sent.  PCP: Dr. Earle Gell - Clearance pending at time of H&P.  IV TXA  Anesthesia Issues: None  Patient was instructed on what medications to stop prior to surgery.  Signed electronically by Ok Edwards, III PA-C

## 2017-02-03 NOTE — Discharge Instructions (Signed)
Radial Site Care °Refer to this sheet in the next few weeks. These instructions provide you with information about caring for yourself after your procedure. Your health care provider may also give you more specific instructions. Your treatment has been planned according to current medical practices, but problems sometimes occur. Call your health care provider if you have any problems or questions after your procedure. °What can I expect after the procedure? °After your procedure, it is typical to have the following: °· Bruising at the radial site that usually fades within 1-2 weeks. °· Blood collecting in the tissue (hematoma) that may be painful to the touch. It should usually decrease in size and tenderness within 1-2 weeks. °Follow these instructions at home: °· Take medicines only as directed by your health care provider. °· You may shower 24-48 hours after the procedure or as directed by your health care provider. Remove the bandage (dressing) and gently wash the site with plain soap and water. Pat the area dry with a clean towel. Do not rub the site, because this may cause bleeding. °· Do not take baths, swim, or use a hot tub until your health care provider approves. °· Check your insertion site every day for redness, swelling, or drainage. °· Do not apply powder or lotion to the site. °· Do not flex or bend the affected arm for 24 hours or as directed by your health care provider. °· Do not push or pull heavy objects with the affected arm for 24 hours or as directed by your health care provider. °· Do not lift over 10 lb (4.5 kg) for 5 days after your procedure or as directed by your health care provider. °· Ask your health care provider when it is okay to: °¨ Return to work or school. °¨ Resume usual physical activities or sports. °¨ Resume sexual activity. °· Do not drive home if you are discharged the same day as the procedure. Have someone else drive you. °· You may drive 24 hours after the procedure  unless otherwise instructed by your health care provider. °· Do not operate machinery or power tools for 24 hours after the procedure. °· If your procedure was done as an outpatient procedure, which means that you went home the same day as your procedure, a responsible adult should be with you for the first 24 hours after you arrive home. °· Keep all follow-up visits as directed by your health care provider. This is important. °Contact a health care provider if: °· You have a fever. °· You have chills. °· You have increased bleeding from the radial site. Hold pressure on the site. °Get help right away if: °· You have unusual pain at the radial site. °· You have redness, warmth, or swelling at the radial site. °· You have drainage (other than a small amount of blood on the dressing) from the radial site. °· The radial site is bleeding, and the bleeding does not stop after 30 minutes of holding steady pressure on the site. °· Your arm or hand becomes pale, cool, tingly, or numb. °This information is not intended to replace advice given to you by your health care provider. Make sure you discuss any questions you have with your health care provider. °Document Released: 12/27/2010 Document Revised: 05/01/2016 Document Reviewed: 06/12/2014 °Elsevier Interactive Patient Education © 2017 [employer] ° °

## 2017-02-03 NOTE — Interval H&P Note (Signed)
History and Physical Interval Note:  02/03/2017 12:45 PM  [name].  has presented today for surgery, with the diagnosis of abnormal stress test  The various methods of treatment have been discussed with the patient and family. After consideration of risks, benefits and other options for treatment, the patient has consented to  Procedure(s): Left Heart Cath and Coronary Angiography (N/A) and possible PCI as a surgical intervention .  The patient's history has been reviewed, patient examined, no change in status, stable for surgery.  I have reviewed the patient's chart and labs.  Questions were answered to the patient's satisfaction.    Ischemic Symptoms? CCS II (Slight limitation of ordinary activity) Anti-ischemic Medical Therapy? No Therapy Non-invasive Test Results? High-risk stress test findings: cardiac mortality >3%/yr Prior CABG? No Previous CABG   Patient Information:   1-2V CAD, no prox LAD  A (7)  Indication: 18; Score: 7   Patient Information:   CTO of 1 vessel, no other CAD  U (5)  Indication: 28; Score: 5   Patient Information:   1V CAD with prox LAD  A (8)  Indication: 34; Score: 8   Patient Information:   2V-CAD with prox LAD  A (8)  Indication: 40; Score: 8   Patient Information:   3V-CAD without LMCA  A (8)  Indication: 46; Score: 8   Patient Information:   3V-CAD without LMCA With Abnormal LV systolic function  A (9)  Indication: 48; Score: 9   Patient Information:   LMCA-CAD  A (9)  Indication: 49; Score: 9   Patient Information:   2V-CAD with prox LAD PCI  A (7)  Indication: 62; Score: 7   Patient Information:   2V-CAD with prox LAD CABG  A (8)  Indication: 62; Score: 8   Patient Information:   3V-CAD without LMCA With Low CAD burden(i.e., 3 focal stenoses, low SYNTAX score) PCI  A (7)  Indication: 63; Score: 7   Patient Information:   3V-CAD without LMCA With Low CAD burden(i.e., 3 focal stenoses,  low SYNTAX score) CABG  A (9)  Indication: 63; Score: 9   Patient Information:   3V-CAD without LMCA E06c - Intermediate-high CAD burden (i.e., multiple diffuse lesions, presence of CTO, or high SYNTAX score) PCI  U (4)  Indication: 64; Score: 4   Patient Information:   3V-CAD without LMCA E06c - Intermediate-high CAD burden (i.e., multiple diffuse lesions, presence of CTO, or high SYNTAX score) CABG  A (9)  Indication: 64; Score: 9   Patient Information:   LMCA-CAD With Isolated LMCA stenosis  PCI  U (6)  Indication: 65; Score: 6   Patient Information:   LMCA-CAD With Isolated LMCA stenosis  CABG  A (9)  Indication: 65; Score: 9   Patient Information:   LMCA-CAD Additional CAD, low CAD burden (i.e., 1- to 2-vessel additional involvement, low SYNTAX score) PCI  U (5)  Indication: 66; Score: 5   Patient Information:   LMCA-CAD Additional CAD, low CAD burden (i.e., 1- to 2-vessel additional involvement, low SYNTAX score) CABG  A (9)  Indication: 66; Score: 9   Patient Information:   LMCA-CAD Additional CAD, intermediate-high CAD burden (i.e., 3-vessel involvement, presence of CTO, or high SYNTAX score) PCI  I (3)  Indication: 67; Score: 3   Patient Information:   LMCA-CAD Additional CAD, intermediate-high CAD burden (i.e., 3-vessel involvement, presence of CTO, or high SYNTAX score) CABG  A (9)  Indication: 67; Score: 9  [name]

## 2017-02-04 ENCOUNTER — Inpatient Hospital Stay (HOSPITAL_COMMUNITY): Payer: Medicare HMO | Admitting: Anesthesiology

## 2017-02-04 ENCOUNTER — Encounter (HOSPITAL_COMMUNITY)
Admission: RE | Disposition: A | Discharge status: Home Health | Payer: Self-pay | Source: Ambulatory Visit | Attending: Orthopedic Surgery

## 2017-02-04 ENCOUNTER — Inpatient Hospital Stay (HOSPITAL_COMMUNITY)
Admission: RE | Admit: 2017-02-04 | Discharge: 2017-02-06 | DRG: 470 | Disposition: A | Discharge status: Home Health | Payer: Medicare HMO | Source: Ambulatory Visit | Attending: Orthopedic Surgery | Admitting: Orthopedic Surgery

## 2017-02-04 ENCOUNTER — Inpatient Hospital Stay (HOSPITAL_COMMUNITY): Payer: Medicare HMO

## 2017-02-04 ENCOUNTER — Encounter (HOSPITAL_COMMUNITY): Payer: Self-pay | Admitting: Cardiology

## 2017-02-04 DIAGNOSIS — K219 Gastro-esophageal reflux disease without esophagitis: Secondary | ICD-10-CM | POA: Diagnosis present

## 2017-02-04 DIAGNOSIS — Z96642 Presence of left artificial hip joint: Secondary | ICD-10-CM | POA: Diagnosis present

## 2017-02-04 DIAGNOSIS — Z8261 Family history of arthritis: Secondary | ICD-10-CM

## 2017-02-04 DIAGNOSIS — M1611 Unilateral primary osteoarthritis, right hip: Principal | ICD-10-CM | POA: Diagnosis present

## 2017-02-04 DIAGNOSIS — Z791 Long term (current) use of non-steroidal anti-inflammatories (NSAID): Secondary | ICD-10-CM

## 2017-02-04 DIAGNOSIS — Z87891 Personal history of nicotine dependence: Secondary | ICD-10-CM

## 2017-02-04 DIAGNOSIS — E7800 Pure hypercholesterolemia, unspecified: Secondary | ICD-10-CM | POA: Diagnosis present

## 2017-02-04 DIAGNOSIS — N400 Enlarged prostate without lower urinary tract symptoms: Secondary | ICD-10-CM | POA: Diagnosis present

## 2017-02-04 DIAGNOSIS — I10 Essential (primary) hypertension: Secondary | ICD-10-CM | POA: Diagnosis present

## 2017-02-04 DIAGNOSIS — M459 Ankylosing spondylitis of unspecified sites in spine: Secondary | ICD-10-CM | POA: Diagnosis present

## 2017-02-04 DIAGNOSIS — M25551 Pain in right hip: Secondary | ICD-10-CM | POA: Diagnosis present

## 2017-02-04 DIAGNOSIS — E785 Hyperlipidemia, unspecified: Secondary | ICD-10-CM | POA: Diagnosis present

## 2017-02-04 DIAGNOSIS — D649 Anemia, unspecified: Secondary | ICD-10-CM | POA: Diagnosis present

## 2017-02-04 DIAGNOSIS — M169 Osteoarthritis of hip, unspecified: Secondary | ICD-10-CM | POA: Diagnosis present

## 2017-02-04 DIAGNOSIS — Z96649 Presence of unspecified artificial hip joint: Secondary | ICD-10-CM

## 2017-02-04 HISTORY — PX: TOTAL HIP ARTHROPLASTY: SHX124

## 2017-02-04 LAB — TYPE AND SCREEN
ABO/RH(D): O POS
Antibody Screen: NEGATIVE

## 2017-02-04 SURGERY — ARTHROPLASTY, HIP, TOTAL, ANTERIOR APPROACH
Anesthesia: Spinal | Site: Hip | Laterality: Right

## 2017-02-04 MED ORDER — LIDOCAINE 2% (20 MG/ML) 5 ML SYRINGE
INTRAMUSCULAR | Status: DC | PRN
  Administered 2017-02-04: 20 mg via INTRAVENOUS
  Administered 2017-02-04: 80 mg via INTRAVENOUS

## 2017-02-04 MED ORDER — ATORVASTATIN CALCIUM 20 MG PO TABS
40.0000 mg | ORAL_TABLET | Freq: Every day | ORAL | Status: DC
  Administered 2017-02-04 – 2017-02-05 (×2): 40 mg via ORAL
  Filled 2017-02-04 (×3): qty 2

## 2017-02-04 MED ORDER — HYDROMORPHONE HCL 1 MG/ML IJ SOLN
INTRAMUSCULAR | Status: AC
  Filled 2017-02-04: qty 1

## 2017-02-04 MED ORDER — PROPOFOL 10 MG/ML IV BOLUS
INTRAVENOUS | Status: DC | PRN
  Administered 2017-02-04: 20 mg via INTRAVENOUS
  Administered 2017-02-04: 10 mg via INTRAVENOUS
  Administered 2017-02-04: 20 mg via INTRAVENOUS
  Administered 2017-02-04: 150 mg via INTRAVENOUS
  Administered 2017-02-04: 10 mg via INTRAVENOUS
  Administered 2017-02-04: 20 mg via INTRAVENOUS

## 2017-02-04 MED ORDER — POLYETHYLENE GLYCOL 3350 17 G PO PACK
17.0000 g | PACK | Freq: Every day | ORAL | Status: DC | PRN
Start: 2017-02-04 — End: 2017-02-06

## 2017-02-04 MED ORDER — DEXAMETHASONE SODIUM PHOSPHATE 10 MG/ML IJ SOLN
10.0000 mg | Freq: Once | INTRAMUSCULAR | Status: AC
  Administered 2017-02-04: 10 mg via INTRAVENOUS

## 2017-02-04 MED ORDER — DEXAMETHASONE SODIUM PHOSPHATE 10 MG/ML IJ SOLN
INTRAMUSCULAR | Status: AC
  Filled 2017-02-04: qty 1

## 2017-02-04 MED ORDER — SUCCINYLCHOLINE CHLORIDE 200 MG/10ML IV SOSY
PREFILLED_SYRINGE | INTRAVENOUS | Status: DC | PRN
  Administered 2017-02-04: 100 mg via INTRAVENOUS

## 2017-02-04 MED ORDER — 0.9 % SODIUM CHLORIDE (POUR BTL) OPTIME
TOPICAL | Status: DC | PRN
  Administered 2017-02-04: 1000 mL

## 2017-02-04 MED ORDER — METHOCARBAMOL 1000 MG/10ML IJ SOLN
500.0000 mg | Freq: Four times a day (QID) | INTRAVENOUS | Status: DC | PRN
  Filled 2017-02-04: qty 5

## 2017-02-04 MED ORDER — ONDANSETRON HCL 4 MG/2ML IJ SOLN: 4.0000 mg | Freq: Four times a day (QID) | INTRAMUSCULAR | Status: DC | PRN

## 2017-02-04 MED ORDER — CHLORHEXIDINE GLUCONATE 4 % EX LIQD: 60.0000 mL | Freq: Once | CUTANEOUS | Status: DC

## 2017-02-04 MED ORDER — STERILE WATER FOR IRRIGATION IR SOLN
Status: DC | PRN
  Administered 2017-02-04: 2000 mL

## 2017-02-04 MED ORDER — BUPIVACAINE HCL (PF) 0.25 % IJ SOLN
INTRAMUSCULAR | Status: AC
  Filled 2017-02-04: qty 30

## 2017-02-04 MED ORDER — EPHEDRINE SULFATE 50 MG/ML IJ SOLN
INTRAMUSCULAR | Status: DC | PRN
  Administered 2017-02-04 (×3): 10 mg via INTRAVENOUS

## 2017-02-04 MED ORDER — FENTANYL CITRATE (PF) 100 MCG/2ML IJ SOLN
INTRAMUSCULAR | Status: AC
  Filled 2017-02-04: qty 2

## 2017-02-04 MED ORDER — CEFAZOLIN SODIUM-DEXTROSE 2-4 GM/100ML-% IV SOLN
2.0000 g | INTRAVENOUS | Status: AC
  Administered 2017-02-04: 2 g via INTRAVENOUS
  Filled 2017-02-04: qty 100

## 2017-02-04 MED ORDER — SODIUM CHLORIDE 0.9 % IV SOLN
INTRAVENOUS | Status: DC
  Administered 2017-02-04: 14:00:00 100 mL/h via INTRAVENOUS
  Administered 2017-02-05: via INTRAVENOUS

## 2017-02-04 MED ORDER — METOCLOPRAMIDE HCL 5 MG/ML IJ SOLN: 5.0000 mg | Freq: Three times a day (TID) | INTRAMUSCULAR | Status: DC | PRN

## 2017-02-04 MED ORDER — SODIUM CHLORIDE 0.9 % IJ SOLN
INTRAMUSCULAR | Status: AC
  Filled 2017-02-04: qty 10

## 2017-02-04 MED ORDER — TAMSULOSIN HCL 0.4 MG PO CAPS
0.4000 mg | ORAL_CAPSULE | Freq: Two times a day (BID) | ORAL | Status: DC
  Administered 2017-02-04 – 2017-02-06 (×4): 0.4 mg via ORAL
  Filled 2017-02-04 (×4): qty 1

## 2017-02-04 MED ORDER — SUGAMMADEX SODIUM 200 MG/2ML IV SOLN
INTRAVENOUS | Status: AC
  Filled 2017-02-04: qty 2

## 2017-02-04 MED ORDER — DOCUSATE SODIUM 100 MG PO CAPS
100.0000 mg | ORAL_CAPSULE | Freq: Two times a day (BID) | ORAL | Status: DC
  Administered 2017-02-04 – 2017-02-06 (×4): 100 mg via ORAL
  Filled 2017-02-04 (×4): qty 1

## 2017-02-04 MED ORDER — PANTOPRAZOLE SODIUM 40 MG PO TBEC
80.0000 mg | DELAYED_RELEASE_TABLET | Freq: Two times a day (BID) | ORAL | Status: DC
  Administered 2017-02-04 – 2017-02-05 (×2): 80 mg via ORAL
  Filled 2017-02-04 (×2): qty 2

## 2017-02-04 MED ORDER — ACETAMINOPHEN 10 MG/ML IV SOLN
1000.0000 mg | Freq: Once | INTRAVENOUS | Status: AC
  Administered 2017-02-04: 1000 mg via INTRAVENOUS
  Filled 2017-02-04: qty 100

## 2017-02-04 MED ORDER — HYDROMORPHONE HCL 2 MG/ML IJ SOLN
INTRAMUSCULAR | Status: AC
  Filled 2017-02-04: qty 1

## 2017-02-04 MED ORDER — PROPOFOL 10 MG/ML IV BOLUS
INTRAVENOUS | Status: AC
  Filled 2017-02-04: qty 20

## 2017-02-04 MED ORDER — ACETAMINOPHEN 650 MG RE SUPP: 650.0000 mg | Freq: Four times a day (QID) | RECTAL | Status: DC | PRN

## 2017-02-04 MED ORDER — METHOCARBAMOL 500 MG PO TABS
500.0000 mg | ORAL_TABLET | Freq: Four times a day (QID) | ORAL | Status: DC | PRN
  Administered 2017-02-04 – 2017-02-06 (×3): 500 mg via ORAL
  Filled 2017-02-04 (×3): qty 1

## 2017-02-04 MED ORDER — LOSARTAN POTASSIUM 50 MG PO TABS
50.0000 mg | ORAL_TABLET | Freq: Every day | ORAL | Status: DC
  Administered 2017-02-05 – 2017-02-06 (×2): 50 mg via ORAL
  Filled 2017-02-04 (×2): qty 1

## 2017-02-04 MED ORDER — FENTANYL CITRATE (PF) 100 MCG/2ML IJ SOLN
INTRAMUSCULAR | Status: DC | PRN
  Administered 2017-02-04 (×4): 50 ug via INTRAVENOUS

## 2017-02-04 MED ORDER — MORPHINE SULFATE (PF) 4 MG/ML IV SOLN: 1.0000 mg | INTRAVENOUS | Status: DC | PRN

## 2017-02-04 MED ORDER — DEXAMETHASONE SODIUM PHOSPHATE 10 MG/ML IJ SOLN
10.0000 mg | Freq: Once | INTRAMUSCULAR | Status: AC
  Administered 2017-02-05: 08:00:00 10 mg via INTRAVENOUS
  Filled 2017-02-04: qty 1

## 2017-02-04 MED ORDER — TRANEXAMIC ACID 1000 MG/10ML IV SOLN
2000.0000 mg | Freq: Once | INTRAVENOUS | Status: DC
  Filled 2017-02-04: qty 20

## 2017-02-04 MED ORDER — ACETAMINOPHEN 500 MG PO TABS
1000.0000 mg | ORAL_TABLET | Freq: Four times a day (QID) | ORAL | Status: AC
  Administered 2017-02-04 – 2017-02-05 (×4): 1000 mg via ORAL
  Filled 2017-02-04 (×4): qty 2

## 2017-02-04 MED ORDER — RIVAROXABAN 10 MG PO TABS
10.0000 mg | ORAL_TABLET | Freq: Every day | ORAL | Status: DC
  Administered 2017-02-05 – 2017-02-06 (×2): 10 mg via ORAL
  Filled 2017-02-04 (×2): qty 1

## 2017-02-04 MED ORDER — PHENOL 1.4 % MT LIQD: 1.0000 | OROMUCOSAL | Status: DC | PRN

## 2017-02-04 MED ORDER — SODIUM CHLORIDE 0.9 % IR SOLN
Status: DC | PRN
  Administered 2017-02-04: 1000 mL

## 2017-02-04 MED ORDER — ACETAMINOPHEN 325 MG PO TABS
650.0000 mg | ORAL_TABLET | Freq: Four times a day (QID) | ORAL | Status: DC | PRN
  Administered 2017-02-06: 06:00:00 650 mg via ORAL
  Filled 2017-02-04: qty 2

## 2017-02-04 MED ORDER — POTASSIUM 99 MG PO TABS: 99.0000 mg | ORAL_TABLET | Freq: Every evening | ORAL | Status: DC

## 2017-02-04 MED ORDER — LACTATED RINGERS IV SOLN
INTRAVENOUS | Status: DC
  Administered 2017-02-04 (×2): via INTRAVENOUS

## 2017-02-04 MED ORDER — ONDANSETRON HCL 4 MG/2ML IJ SOLN
INTRAMUSCULAR | Status: DC | PRN
  Administered 2017-02-04: 4 mg via INTRAVENOUS

## 2017-02-04 MED ORDER — POTASSIUM CHLORIDE CRYS ER 10 MEQ PO TBCR
10.0000 meq | EXTENDED_RELEASE_TABLET | Freq: Every day | ORAL | Status: DC
  Administered 2017-02-06: 10 meq via ORAL
  Filled 2017-02-04 (×2): qty 1

## 2017-02-04 MED ORDER — ROCURONIUM BROMIDE 50 MG/5ML IV SOSY
PREFILLED_SYRINGE | INTRAVENOUS | Status: DC | PRN
  Administered 2017-02-04: 40 mg via INTRAVENOUS

## 2017-02-04 MED ORDER — DIPHENHYDRAMINE HCL 12.5 MG/5ML PO ELIX: 12.5000 mg | ORAL_SOLUTION | ORAL | Status: DC | PRN

## 2017-02-04 MED ORDER — ONDANSETRON HCL 4 MG/2ML IJ SOLN
INTRAMUSCULAR | Status: AC
  Filled 2017-02-04: qty 2

## 2017-02-04 MED ORDER — HYDROCHLOROTHIAZIDE 12.5 MG PO CAPS
12.5000 mg | ORAL_CAPSULE | Freq: Every day | ORAL | Status: DC
  Administered 2017-02-05 – 2017-02-06 (×2): 12.5 mg via ORAL
  Filled 2017-02-04 (×2): qty 1

## 2017-02-04 MED ORDER — BUPIVACAINE HCL (PF) 0.25 % IJ SOLN
INTRAMUSCULAR | Status: DC | PRN
  Administered 2017-02-04: 30 mL

## 2017-02-04 MED ORDER — MENTHOL 3 MG MT LOZG: 1.0000 | LOZENGE | OROMUCOSAL | Status: DC | PRN

## 2017-02-04 MED ORDER — FLEET ENEMA 7-19 GM/118ML RE ENEM: 1.0000 | ENEMA | Freq: Once | RECTAL | Status: DC | PRN

## 2017-02-04 MED ORDER — HYDROMORPHONE HCL 1 MG/ML IJ SOLN
INTRAMUSCULAR | Status: DC | PRN
  Administered 2017-02-04: .4 mg via INTRAVENOUS
  Administered 2017-02-04: .6 mg via INTRAVENOUS
  Administered 2017-02-04: .4 mg via INTRAVENOUS
  Administered 2017-02-04: .2 mg via INTRAVENOUS
  Administered 2017-02-04: .4 mg via INTRAVENOUS

## 2017-02-04 MED ORDER — TRAMADOL HCL 50 MG PO TABS: 50.0000 mg | ORAL_TABLET | Freq: Four times a day (QID) | ORAL | Status: DC | PRN

## 2017-02-04 MED ORDER — CEFAZOLIN SODIUM-DEXTROSE 2-4 GM/100ML-% IV SOLN
2.0000 g | Freq: Four times a day (QID) | INTRAVENOUS | Status: AC
  Administered 2017-02-04 (×2): 2 g via INTRAVENOUS
  Filled 2017-02-04 (×2): qty 100

## 2017-02-04 MED ORDER — BISACODYL 10 MG RE SUPP: 10.0000 mg | Freq: Every day | RECTAL | Status: DC | PRN

## 2017-02-04 MED ORDER — EPHEDRINE 5 MG/ML INJ
INTRAVENOUS | Status: AC
  Filled 2017-02-04: qty 10

## 2017-02-04 MED ORDER — METOCLOPRAMIDE HCL 5 MG PO TABS
5.0000 mg | ORAL_TABLET | Freq: Three times a day (TID) | ORAL | Status: DC | PRN
  Administered 2017-02-04: 10 mg via ORAL
  Filled 2017-02-04: qty 2

## 2017-02-04 MED ORDER — ONDANSETRON HCL 4 MG PO TABS: 4.0000 mg | ORAL_TABLET | Freq: Four times a day (QID) | ORAL | Status: DC | PRN

## 2017-02-04 MED ORDER — PROMETHAZINE HCL 25 MG/ML IJ SOLN: 6.2500 mg | INTRAMUSCULAR | Status: DC | PRN

## 2017-02-04 MED ORDER — LOSARTAN POTASSIUM-HCTZ 50-12.5 MG PO TABS: 1.0000 | ORAL_TABLET | Freq: Every day | ORAL | Status: DC

## 2017-02-04 MED ORDER — OXYCODONE HCL 5 MG PO TABS
5.0000 mg | ORAL_TABLET | ORAL | Status: DC | PRN
  Administered 2017-02-04: 5 mg via ORAL
  Administered 2017-02-04: 10 mg via ORAL
  Administered 2017-02-05: 5 mg via ORAL
  Administered 2017-02-06: 10 mg via ORAL
  Filled 2017-02-04 (×2): qty 2
  Filled 2017-02-04 (×2): qty 1

## 2017-02-04 MED ORDER — HYDROMORPHONE HCL 1 MG/ML IJ SOLN
0.2500 mg | INTRAMUSCULAR | Status: DC | PRN
  Administered 2017-02-04 (×2): 0.5 mg via INTRAVENOUS

## 2017-02-04 MED FILL — Heparin Sodium (Porcine) Inj 1000 Unit/ML: INTRAMUSCULAR | Qty: 10 | Status: AC

## 2017-02-04 SURGICAL SUPPLY — 41 items
BAG DECANTER FOR FLEXI CONT (MISCELLANEOUS) ×3 IMPLANT
BAG SPEC THK2 15X12 ZIP CLS (MISCELLANEOUS) ×1
BAG ZIPLOCK 12X15 (MISCELLANEOUS) ×2 IMPLANT
BLADE SAG 18X100X1.27 (BLADE) ×3 IMPLANT
CAPT HIP TOTAL 2 ×2 IMPLANT
CLOSURE WOUND 1/2 X4 (GAUZE/BANDAGES/DRESSINGS) ×2
CLOTH BEACON ORANGE TIMEOUT ST (SAFETY) ×3 IMPLANT
COVER PERINEAL POST (MISCELLANEOUS) ×3 IMPLANT
DECANTER SPIKE VIAL GLASS SM (MISCELLANEOUS) ×3 IMPLANT
DRAPE STERI IOBAN 125X83 (DRAPES) ×3 IMPLANT
DRAPE U-SHAPE 47X51 STRL (DRAPES) ×6 IMPLANT
DRSG ADAPTIC 3X8 NADH LF (GAUZE/BANDAGES/DRESSINGS) ×3 IMPLANT
DRSG MEPILEX BORDER 4X4 (GAUZE/BANDAGES/DRESSINGS) ×3 IMPLANT
DRSG MEPILEX BORDER 4X8 (GAUZE/BANDAGES/DRESSINGS) ×3 IMPLANT
DURAPREP 26ML APPLICATOR (WOUND CARE) ×3 IMPLANT
ELECT REM PT RETURN 9FT ADLT (ELECTROSURGICAL) ×3
ELECTRODE REM PT RTRN 9FT ADLT (ELECTROSURGICAL) ×1 IMPLANT
EVACUATOR 1/8 PVC DRAIN (DRAIN) ×3 IMPLANT
GLOVE BIO SURGEON STRL SZ7 (GLOVE) ×2 IMPLANT
GLOVE BIO SURGEON STRL SZ7.5 (GLOVE) ×3 IMPLANT
GLOVE BIO SURGEON STRL SZ8 (GLOVE) ×6 IMPLANT
GLOVE BIOGEL PI IND STRL 7.0 (GLOVE) IMPLANT
GLOVE BIOGEL PI IND STRL 7.5 (GLOVE) IMPLANT
GLOVE BIOGEL PI IND STRL 8 (GLOVE) ×2 IMPLANT
GLOVE BIOGEL PI INDICATOR 7.0 (GLOVE) ×2
GLOVE BIOGEL PI INDICATOR 7.5 (GLOVE) ×6
GLOVE BIOGEL PI INDICATOR 8 (GLOVE) ×4
GLOVE SURG SS PI 7.5 STRL IVOR (GLOVE) ×2 IMPLANT
GOWN SPEC L3 XXLG W/TWL (GOWN DISPOSABLE) ×2 IMPLANT
GOWN STRL REUS W/TWL LRG LVL3 (GOWN DISPOSABLE) ×5 IMPLANT
GOWN STRL REUS W/TWL XL LVL3 (GOWN DISPOSABLE) ×3 IMPLANT
PACK ANTERIOR HIP CUSTOM (KITS) ×3 IMPLANT
STRIP CLOSURE SKIN 1/2X4 (GAUZE/BANDAGES/DRESSINGS) ×3 IMPLANT
SUT ETHIBOND NAB CT1 #1 30IN (SUTURE) ×3 IMPLANT
SUT MNCRL AB 4-0 PS2 18 (SUTURE) ×3 IMPLANT
SUT VIC AB 2-0 CT1 27 (SUTURE) ×9
SUT VIC AB 2-0 CT1 TAPERPNT 27 (SUTURE) ×2 IMPLANT
SUT VLOC 180 0 24IN GS25 (SUTURE) ×3 IMPLANT
SYR 50ML LL SCALE MARK (SYRINGE) ×2 IMPLANT
TRAY FOLEY W/METER SILVER 16FR (SET/KITS/TRAYS/PACK) ×3 IMPLANT
YANKAUER SUCT BULB TIP 10FT TU (MISCELLANEOUS) ×3 IMPLANT

## 2017-02-04 NOTE — Anesthesia Postprocedure Evaluation (Addendum)
Anesthesia Post Note  Patient: [name].  Procedure(s) Performed: Procedure(s) (LRB): RIGHT TOTAL HIP ARTHROPLASTY ANTERIOR APPROACH (Right)  Patient location during evaluation: PACU Anesthesia Type: Spinal Level of consciousness: awake and alert Pain management: pain level controlled Vital Signs Assessment: post-procedure vital signs reviewed and stable Respiratory status: spontaneous breathing, nonlabored ventilation, respiratory function stable and patient connected to nasal cannula oxygen Cardiovascular status: blood pressure returned to baseline and stable Postop Assessment: no signs of nausea or vomiting Anesthetic complications: no       Last Vitals:  Vitals:   02/04/17 1330 02/04/17 1435  BP: 126/68 124/61  Pulse: 88 97  Resp: 16 16  Temp: 36.8 C 36.8 C    Last Pain:  Vitals:   02/04/17 1435  TempSrc: Axillary  PainSc:                  Lorenda Grecco S

## 2017-02-04 NOTE — Anesthesia Procedure Notes (Signed)
Procedure Name: Intubation Date/Time: 02/04/2017 10:40 AM Performed by: Maxwell Caul Pre-anesthesia Checklist: Patient identified, Emergency Drugs available, Suction available and Patient being monitored Patient Re-evaluated:Patient Re-evaluated prior to inductionOxygen Delivery Method: Circle system utilized Preoxygenation: Pre-oxygenation with 100% oxygen Intubation Type: IV induction Ventilation: Mask ventilation without difficulty Laryngoscope Size: Mac and 4 Grade View: Grade I Tube type: Oral Tube size: 7.5 mm Number of attempts: 1 Airway Equipment and Method: Stylet Placement Confirmation: ETT inserted through vocal cords under direct vision,  positive ETCO2 and breath sounds checked- equal and bilateral Secured at: 21 cm Tube secured with: Tape Dental Injury: Teeth and Oropharynx as per pre-operative assessment

## 2017-02-04 NOTE — Anesthesia Preprocedure Evaluation (Addendum)
Anesthesia Evaluation  Patient identified by MRN, date of birth, ID band Patient awake    Reviewed: Allergy & Precautions, NPO status , Patient's Chart, lab work & pertinent test results  Airway Mallampati: II  TM Distance: >3 FB Neck ROM: Full    Dental no notable dental hx.    Pulmonary neg pulmonary ROS, former smoker,    Pulmonary exam normal breath sounds clear to auscultation       Cardiovascular hypertension, +CHF  Normal cardiovascular exam Rhythm:Regular Rate:Normal  1. No significant coronary artery disease, circumflex has mild disease. LAD has minimal disease. Right coronary artery is dominant. 2. Low normal LVEF at 45-50%.   Neuro/Psych negative neurological ROS  negative psych ROS   GI/Hepatic Neg liver ROS, GERD  ,  Endo/Other  negative endocrine ROS  Renal/GU negative Renal ROS  negative genitourinary   Musculoskeletal  (+) Arthritis , Ankylosing spondylitis   Abdominal   Peds negative pediatric ROS (+)  Hematology negative hematology ROS (+)   Anesthesia Other Findings   Reproductive/Obstetrics negative OB ROS                            Anesthesia Physical Anesthesia Plan  ASA: III  Anesthesia Plan: Spinal   Post-op Pain Management:    Induction: Intravenous  Airway Management Planned:   Additional Equipment:   Intra-op Plan:   Post-operative Plan:   Informed Consent: I have reviewed the patients History and Physical, chart, labs and discussed the procedure including the risks, benefits and alternatives for the proposed anesthesia with the patient or authorized representative who has indicated his/her understanding and acceptance.   Dental advisory given  Plan Discussed with: CRNA and Surgeon  Anesthesia Plan Comments: (GA backup secondary to Ankylosing Spondylitis)       Anesthesia Quick Evaluation

## 2017-02-04 NOTE — Transfer of Care (Signed)
Immediate Anesthesia Transfer of Care Note  Patient: [name]  Procedure(s) Performed: Procedure(s) with comments: RIGHT TOTAL HIP ARTHROPLASTY ANTERIOR APPROACH (Right) - requests 174mins  Patient Location: PACU  Anesthesia Type:General  Level of Consciousness:  sedated, patient cooperative and responds to stimulation  Airway & Oxygen Therapy:Patient Spontanous Breathing and Patient connected to face mask oxgen  Post-op Assessment:  Report given to PACU RN and Post -op Vital signs reviewed and stable  Post vital signs:  Reviewed and stable  Last Vitals:  Vitals:   02/04/17 0931 02/04/17 1238  BP: (!) 148/77 (!) 155/69  Pulse: 96 (!) 109  Resp: 18 12  Temp: 123XX123 C     Complications: No apparent anesthesia complications

## 2017-02-04 NOTE — Op Note (Signed)
OPERATIVE REPORT- TOTAL HIP ARTHROPLASTY   PREOPERATIVE DIAGNOSIS: Osteoarthritis of the Right hip.   POSTOPERATIVE DIAGNOSIS: Osteoarthritis of the Right  hip.   PROCEDURE: Right total hip arthroplasty, anterior approach.   SURGEON: Gaynelle Arabian, MD   ASSISTANT: Arlee Muslim, PA-C  ANESTHESIA:  General  ESTIMATED BLOOD LOSS:-600 ml    DRAINS: Hemovac x1.   COMPLICATIONS: None   CONDITION: PACU - hemodynamically stable.   BRIEF CLINICAL NOTE: [name]. is a 83 y.o. male who has advanced end-  stage arthritis of their Right  hip with progressively worsening pain and  dysfunction.The patient has failed nonoperative management and presents for  total hip arthroplasty.   PROCEDURE IN DETAIL: After successful administration of spinal  anesthetic, the traction boots for the [facility] bed were placed on both  feet and the patient was placed onto the [facility] bed, boots placed into the leg  holders. The Right hip was then isolated from the perineum with plastic  drapes and prepped and draped in the usual sterile fashion. ASIS and  greater trochanter were marked and a oblique incision was made, starting  at about 1 cm lateral and 2 cm distal to the ASIS and coursing towards  the anterior cortex of the femur. The skin was cut with a 10 blade  through subcutaneous tissue to the level of the fascia overlying the  tensor fascia lata muscle. The fascia was then incised in line with the  incision at the junction of the anterior third and posterior 2/3rd. The  muscle was teased off the fascia and then the interval between the TFL  and the rectus was developed. The Hohmann retractor was then placed at  the top of the femoral neck over the capsule. The vessels overlying the  capsule were cauterized and the fat on top of the capsule was removed.  A Hohmann retractor was then placed anterior underneath the rectus  femoris to give exposure to the entire anterior capsule. A T-shaped   capsulotomy was performed. The edges were tagged and the femoral head  was identified.       Osteophytes are removed off the superior acetabulum.  The femoral neck was then cut in situ with an oscillating saw. Traction  was then applied to the left lower extremity utilizing the [facility]  traction. The femoral head was then removed. Retractors were placed  around the acetabulum and then circumferential removal of the labrum was  performed. Osteophytes were also removed. Reaming starts at 47 mm to  medialize and  Increased in 2 mm increments to 53 mm. We reamed in  approximately 40 degrees of abduction, 20 degrees anteversion. A 54 mm  pinnacle acetabular shell was then impacted in anatomic position under  fluoroscopic guidance with excellent purchase. We did not need to place  any additional dome screws. A 36 mm neutral + 4 marathon liner was then  placed into the acetabular shell.       The femoral lift was then placed along the lateral aspect of the femur  just distal to the vastus ridge. The leg was  externally rotated and capsule  was stripped off the inferior aspect of the femoral neck down to the  level of the lesser trochanter, this was done with electrocautery. The femur was lifted after this was performed. The  leg was then placed in an extended and adducted position essentially delivering the femur. We also removed the capsule superiorly and the piriformis from the  piriformis fossa to gain excellent exposure of the  proximal femur. Rongeur was used to remove some cancellous bone to get  into the lateral portion of the proximal femur for placement of the  initial starter reamer. The starter broaches was placed  the starter broach  and was shown to go down the center of the canal. Broaching  with the  [place] system was then performed starting at size 8, coursing  Up to size 12. A size 12 had excellent torsional and rotational  and axial stability. The trial standard offset neck was then  placed  with a 36 + 1.5 trial head. The hip was then reduced. We confirmed that  the stem was in the canal both on AP and lateral x-rays. It also has excellent sizing. The hip was reduced with outstanding stability through full extension and full external rotation.. AP pelvis was taken and the leg lengths were measured and found to be equal. Hip was then dislocated again and the femoral head and neck removed. The  femoral broach was removed. Size 12 [place] stem with a standard offset  neck was then impacted into the femur following native anteversion. Has  excellent purchase in the canal. Excellent torsional and rotational and  axial stability. It is confirmed to be in the canal on AP and lateral  fluoroscopic views. The 36 + 1.5 ceramic head was placed and the hip  reduced with outstanding stability. Again AP pelvis was taken and it  confirmed that the leg lengths were equal. The wound was then copiously  irrigated with saline solution and the capsule reattached and repaired  with Ethibond suture. 30 ml of .25% Bupivicaine was  injected into the capsule and into the edge of the tensor fascia lata as well as subcutaneous tissue. The fascia overlying the tensor fascia lata was then closed with a running #1 V-Loc. Subcu was closed with interrupted 2-0 Vicryl and subcuticular running 4-0 Monocryl. Incision was cleaned  and dried. Steri-Strips and a bulky sterile dressing applied. Hemovac  drain was hooked to suction and then the patient was awakened and transported to  recovery in stable condition.        Please note that a surgical assistant was a medical necessity for this procedure to perform it in a safe and expeditious manner. Assistant was necessary to provide appropriate retraction of vital neurovascular structures and to prevent femoral fracture and allow for anatomic placement of the prosthesis.  Gaynelle Arabian, M.D.

## 2017-02-04 NOTE — Interval H&P Note (Signed)
History and Physical Interval Note:  02/04/2017 10:06 AM  [name].  has presented today for surgery, with the diagnosis of Osteoarthritis Right Hip  The various methods of treatment have been discussed with the patient and family. After consideration of risks, benefits and other options for treatment, the patient has consented to  Procedure(s) with comments: [place] (Right) - requests 171mins as a surgical intervention .  The patient's history has been reviewed, patient examined, no change in status, stable for surgery.  I have reviewed the patient's chart and labs.  Questions were answered to the patient's satisfaction.     Gearlean Alf

## 2017-02-04 NOTE — H&P (View-Only) (Signed)
[name] DOB: January 29, 1934 Married / Language: English / Race: White Male Date of Admission:  02/04/2017 CC:  Right hip pain and stiffness History of Present Illness  The patient is a 83 year old male who comes in for a preoperative History and Physical. The patient is scheduled for a right total hip arthroplasty (anterior) to be performed by Dr. Dione Plover. Aluisio, MD at [facility] on 02-04-2017. The patient is a 83 year old male who presents today for follow up of their hip. The patient is being followed for their right hip pain and osteoarthritis. Symptoms reported include: pain. The patient feels that they are worse with time and report their pain level to be mild to moderate. Current treatment includes: activity modification. The patient has reported improvement of their symptoms with: Cortisone injections and physical therapy. He is feeling some better from the cortisone injection and from the physical therapy but not as much as he would like. He is worried that he is going to become more and more limited in his activity. He is considering total hip replacement at this point. Unfortunately, his right hip is getting progressively worse over time. It is definitely starting to limit what he can and cannot do. Cortisone injection did help some, but did not last long. Physical therapy helped with some strength, but he still has the pain and dysfunction. He has a history of ankylosing spondylitis, thus is stiff to begin with, but this is really impacting him significantly now. He would like to get the right hip replaced at this time. They have been treated conservatively in the past for the above stated problem and despite conservative measures, they continue to have progressive pain and severe functional limitations and dysfunction. They have failed non-operative management including home exercise, medications, and injections. It is felt that they would benefit from undergoing total joint  replacement. Risks and benefits of the procedure have been discussed with the patient and they elect to proceed with surgery. There are no active contraindications to surgery such as ongoing infection or rapidly progressive neurological disease.  Problem List/Past Medical Shoulder impingement syndrome (M75.40)  Arthralgia of left hip (M25.552)  S/P total hip arthroplasty (V43.64)  Left knee pain (M25.562)  Internal derangement of left knee (M23.92)  Sprain of left knee, initial encounter [id number])  Stiffness of left knee (M25.662)  Swelling of left knee joint (M25.462)  Gastroesophageal Reflux Disease  High blood pressure  Hypercholesterolemia  Prostate Disease  Sleep Apnea  Primary osteoarthritis of right hip (M16.11)  Impaired Memory  Impaired Vision  wears glasses Impaired Hearing  hearing aids Hiatal Hernia  Barrett's Esophagus  Enlarged prostate  Measles  Mumps  Bursitis   Allergies No Known Drug Allergies   Family History Diabetes Mellitus  Father, Brother. father and brother Heart Disease  grandmother fathers side and grandfather fathers side Osteoarthritis  mother and grandmother mothers side  Social History Alcohol use  current drinker; drinks beer and hard liquor; only occasionally per week Children  3 Current work status  retired Engineer, agricultural (Currently)  no Drug/Alcohol Rehab (Previously)  no Exercise  Exercises daily; does running / walking and gym / weights Illicit drug use  no Living situation  live alone Marital status  widowed Number of flights of stairs before winded  2-3 Pain Contract  no Tobacco / smoke exposure  no Tobacco use  former smoker; smoke(d) 1 1/2 pack(s) per day Advance Directives  Living Will, Healthcare POA  Medication History  Potassium Gluconate ER (595MG  Tablet ER, Oral) Active. Super B Complex Active. Atorvastatin Calcium (Oral) Specific strength unknown -  Active. Hydrochlorothiazide (25MG  Tablet, Oral) Active. Naproxen DR (375MG  Tablet DR, Oral) Active. (1 bid) Tamsulosin HCl (0.4MG  Capsule, Oral) Active. Omeprazole (40MG  Capsule DR, Oral) Active. (1 bid)  Past Surgical History  Hemorrhoidectomy  Total Hip Replacement  left   Review of Systems General Not Present- Chills, Fatigue, Fever, Memory Loss, Night Sweats, Weight Gain and Weight Loss. Skin Not Present- Eczema, Hives, Itching, Lesions and Rash. HEENT Present- Hearing Loss and Tinnitus. Not Present- Dentures, Double Vision, Headache and Visual Loss. Respiratory Not Present- Allergies, Chronic Cough, Coughing up blood, Shortness of breath at rest and Shortness of breath with exertion. Cardiovascular Not Present- Chest Pain, Difficulty Breathing Lying Down, Murmur, Palpitations, Racing/skipping heartbeats and Swelling. Gastrointestinal Not Present- Abdominal Pain, Bloody Stool, Constipation, Diarrhea, Difficulty Swallowing, Heartburn, Jaundice, Loss of appetitie, Nausea and Vomiting. Male Genitourinary Present- Urinary frequency and Urinating at Night. Not Present- Blood in Urine, Discharge, Flank Pain, Incontinence, Painful Urination, Urgency, Urinary Retention and Weak urinary stream. Musculoskeletal Present- Joint Pain and Morning Stiffness. Not Present- Back Pain, Joint Swelling, Muscle Pain, Muscle Weakness and Spasms. Neurological Not Present- Blackout spells, Difficulty with balance, Dizziness, Paralysis, Tremor and Weakness. Psychiatric Not Present- Insomnia.  Vitals  Weight: 222 lb Height: 70.5in Body Surface Area: 2.19 m Body Mass Index: 31.4 kg/m  Pulse: 76 (Regular)  BP: 162/82 (Sitting, Left Arm, Standard)   Physical Exam General Mental Status -Alert, cooperative and good historian. General Appearance-pleasant, Not in acute distress. Orientation-Oriented X3. Build & Nutrition-Well nourished and Well developed.  Head and  Neck Head-normocephalic, atraumatic . Neck Global Assessment - supple, no bruit auscultated on the right, no bruit auscultated on the left.  Eye Pupil - Bilateral-Regular and Round. Motion - Bilateral-EOMI.  Chest and Lung Exam Auscultation Breath sounds - clear at anterior chest wall and clear at posterior chest wall. Adventitious sounds - No Adventitious sounds.  Cardiovascular Auscultation Rhythm - Regular rate and rhythm. Heart Sounds - S1 WNL and S2 WNL. Murmurs & Other Heart Sounds - Auscultation of the heart reveals - No Murmurs.  Abdomen Palpation/Percussion Tenderness - Abdomen is non-tender to palpation. Rigidity (guarding) - Abdomen is soft. Auscultation Auscultation of the abdomen reveals - Bowel sounds normal.  Male Genitourinary Note: Not done, not pertinent to present illness   Musculoskeletal Note: He is in no distress. His right hip can be flexed about 90, minimal internal rotation, about 10 to 20 of external rotation, 20 abduction. He has a significantly antalgic gait pattern on his right at this time.  RADIOGRAPHS His radiographs do show bone-on-bone arthritis in that right hip with subchondral cystic formation.  Assessment & Plan Primary osteoarthritis of right hip (M16.11)  Right Total Hip Replacement - Anterior Approach  Disposition: Home with help. He requests to go straight to outpatient therapy at [facility] following his surgery. He will start on Monday March 5th. Prescription for therapy was sent.  PCP: Dr. Earle Gell - Clearance pending at time of H&P.  IV TXA  Anesthesia Issues: None  Patient was instructed on what medications to stop prior to surgery.  Signed electronically by Ok Edwards, III PA-C

## 2017-02-05 ENCOUNTER — Encounter (HOSPITAL_COMMUNITY): Payer: Self-pay | Admitting: Orthopedic Surgery

## 2017-02-05 LAB — BASIC METABOLIC PANEL
ANION GAP: 9 (ref 5–15)
BUN: 18 mg/dL (ref 6–20)
CALCIUM: 8.8 mg/dL — AB (ref 8.9–10.3)
CO2: 27 mmol/L (ref 22–32)
CREATININE: 1.04 mg/dL (ref 0.61–1.24)
Chloride: 98 mmol/L — ABNORMAL LOW (ref 101–111)
GFR calc Af Amer: >60 mL/min (ref >60)
GFR calc non Af Amer: >60 mL/min (ref >60)
GLUCOSE: 102 mg/dL — AB (ref 65–99)
Potassium: 4.2 mmol/L (ref 3.5–5.1)
Sodium: 134 mmol/L — ABNORMAL LOW (ref 135–145)

## 2017-02-05 LAB — CBC
HEMATOCRIT: 36.4 % — AB (ref 39.0–52.0)
Hemoglobin: 12.4 g/dL — ABNORMAL LOW (ref 13.0–17.0)
MCH: 32.1 pg (ref 26.0–34.0)
MCHC: 34.1 g/dL (ref 30.0–36.0)
MCV: 94.3 fL (ref 78.0–100.0)
Platelets: 271 10*3/uL (ref 150–400)
RBC: 3.86 MIL/uL — ABNORMAL LOW (ref 4.22–5.81)
RDW: 12 % (ref 11.5–15.5)
WBC: 17.5 10*3/uL — ABNORMAL HIGH (ref 4.0–10.5)

## 2017-02-05 MED ORDER — TRAMADOL HCL 50 MG PO TABS: 50.0000 mg | ORAL_TABLET | Freq: Four times a day (QID) | ORAL | 0 refills | Status: DC | PRN

## 2017-02-05 MED ORDER — METHOCARBAMOL 500 MG PO TABS: 500.0000 mg | ORAL_TABLET | Freq: Four times a day (QID) | ORAL | 0 refills | Status: DC | PRN

## 2017-02-05 MED ORDER — RIVAROXABAN 10 MG PO TABS
10.0000 mg | ORAL_TABLET | Freq: Every day | ORAL | 0 refills | Status: DC
Start: 2017-02-06 — End: 2017-02-13

## 2017-02-05 MED ORDER — OXYCODONE HCL 5 MG PO TABS: 5.0000 mg | ORAL_TABLET | ORAL | 0 refills | Status: DC | PRN

## 2017-02-05 MED ORDER — OMEPRAZOLE 20 MG PO CPDR
40.0000 mg | DELAYED_RELEASE_CAPSULE | Freq: Every day | ORAL | Status: DC
  Administered 2017-02-06: 10:00:00 40 mg via ORAL
  Filled 2017-02-05: qty 2

## 2017-02-05 NOTE — Care Management Note (Signed)
Case Management Note  Patient Details  Name: [name]. MRN: [id number] Date of Birth: 08-09-1934  Subjective/Objective:                  RIGHT TOTAL HIP ARTHROPLASTY ANTERIOR APPROACH (Right) Action/Plan: Discharge planning Expected Discharge Date:  02/05/17               Expected Discharge Plan:  [place]  In-House Referral:     Discharge planning Services  CM Consult  Post Acute Care Choice:  Home Health, Durable Medical Equipment Choice offered to:  Patient  DME Arranged:  [facility] platform DME Agency:  [place]:  PT [place]:  [facility]  Status of Service:  Completed, signed off  If discussed at [employer], dates discussed:    Additional Comments: CM met with pt in room to offer choice of home health agency. Pt chooses [facility] to render HHPT. Referral given to [facility] rep, tim.  Cm notified [place] DME rep, Joelene Millin to please deliver the right Platform rolling walker to room prior to discharge. Dellie Catholic, RN 02/05/2017, 10:56 AM

## 2017-02-05 NOTE — Evaluation (Signed)
Occupational Therapy Evaluation Patient Details Name: [name]. MRN: [id number] DOB: 1934/03/20 Today's Date: 02/05/2017    History of Present Illness Pt is an 83 year old male s/p R direct anterior THA with hx of L THA with multiple left hip surgeries as well as recent cardiac cath with R radial artery access   Clinical Impression   Pt was admitted for the above. All education was completed. No further OT is needed at this time    Follow Up Recommendations  No OT follow up;Supervision/Assistance - 24 hour    Equipment Recommendations  None recommended by OT    Recommendations for Other Services       Precautions / Restrictions Precautions Precautions: Fall Restrictions Weight Bearing Restrictions: Yes RUE Weight Bearing: Weight bear through elbow only Other Position/Activity Restrictions: per orders - limit weight bearing on R wrist due to recent cardiac cath, R hip WBAT      Mobility Bed Mobility Overal bed mobility: Needs Assistance Bed Mobility: Supine to Sit     Supine to sit: Min assist     General bed mobility comments: assist for R LE  Transfers Overall transfer level: Needs assistance Equipment used: Right platform walker Transfers: Sit to/from Stand Sit to Stand: Min assist         General transfer comment: verbal cues for UE and LE positioning, assist to rise and steady    Balance                                            ADL Overall ADL's : Needs assistance/impaired     Grooming: Supervision/safety;Standing;Oral care                   Toilet Transfer: Min guard;BSC;Ambulation   Toileting- Clothing Manipulation and Hygiene: Min guard;Sit to/from stand   Tub/ Banker: Walk-in shower;Shower seat;Ambulation Tub/Shower Transfer Details (indicate cue type and reason): platform walker Functional mobility during ADLs: Min guard General ADL Comments: cues to keep walker around body as pt tried to move it  out of the way a couple of times.  He states he will be off R wrist restrictions Friday.  He has AE at home and plans to get a different sock aide (flat type)     Vision         Perception     Praxis      Pertinent Vitals/Pain Pain Assessment: 0-10 Pain Score: 3  Pain Location: R hip Pain Descriptors / Indicators: Aching;Sore Pain Intervention(s): Limited activity within patient's tolerance;Monitored during session;Premedicated before session;Ice applied;Repositioned     Hand Dominance     Extremity/Trunk Assessment Upper Extremity Assessment Upper Extremity Assessment: Overall WFL for tasks assessed (R radial wrist s/p cath--NWB)          Communication Communication Communication: HOH   Cognition Arousal/Alertness: Awake/alert Behavior During Therapy: WFL for tasks assessed/performed Overall Cognitive Status: Within Functional Limits for tasks assessed                     General Comments       Exercises       Shoulder Instructions      Home Living Family/patient expects to be discharged to:: Private residence Living Arrangements: Spouse/significant other Available Help at Discharge: Family Type of Home: House Home Access: Stairs to enter [employer] of Steps: 3  Home Layout: Able to live on main level with bedroom/bathroom     Bathroom Shower/Tub: Hospital doctor Toilet: Handicapped height     Home Equipment: Bedside commode;Shower seat          Prior Functioning/Environment Level of Independence: Independent with assistive device(s)        Comments: used AE        OT Problem List:        OT Treatment/Interventions:      OT Goals(Current goals can be found in the care plan section) Acute Rehab OT Goals OT Goal Formulation: All assessment and education complete, DC therapy  OT Frequency:     Barriers to D/C:            Co-evaluation              End of Session    Activity Tolerance:  Patient tolerated treatment well Patient left: in chair;with call bell/phone within reach;with chair alarm set;with family/visitor present  OT Visit Diagnosis: Muscle weakness (generalized) (M62.81)                ADL either performed or assessed with clinical judgement  Time: 1238-1259 OT Time Calculation (min): 21 min Charges:  OT General Charges $OT Visit: 1 Procedure OT Evaluation $OT Eval Low Complexity: 1 Procedure G-Codes:     [place], OTR/L [id number] 02/05/2017  [name] 02/05/2017, 1:28 PM

## 2017-02-05 NOTE — Discharge Summary (Signed)
Physician Discharge Summary   Patient ID: [name]. MRN: [id number] DOB/AGE: January 14, 1934 83 y.o.  Admit date: 02/04/2017 Discharge date: 02/06/2017  Primary Diagnosis: Osteoarthritis of the Right hip.    Admission Diagnoses:  Past Medical History:  Diagnosis Date  . Anemia   . Ankylosing spondylitis ([place])   . Arthritis 1974  . Barrett's esophagus 2004  . BPH (benign prostatic hyperplasia)   . Complication of anesthesia    LOWER EXTREMITY SLOW TO AWAKEN   . Diverticulosis   . DJD (degenerative joint disease)    CERVICAL  . GERD (gastroesophageal reflux disease)   . Hiatal hernia   . Hyperlipidemia   . Hypertension   . Nose abrasion    PRE CANCEROUS AREA LEFT SIDE OF NOSE, ABRASION RIGHT LOWER LEG HEALING  . Sleep disturbance    Discharge Diagnoses:   Principal Problem:   OA (osteoarthritis) of hip  Estimated body mass index is 30.82 kg/m as calculated from the following:   Height as of this encounter: _0  (1.803 m).   Weight as of this encounter: 100.2 kg (221 lb).  Procedure(s) (LRB): RIGHT TOTAL HIP ARTHROPLASTY ANTERIOR APPROACH (Right)   Consults: None  HPI: [name]. is a 83 y.o. male who has advanced end-  stage arthritis of their Right  hip with progressively worsening pain and  dysfunction.The patient has failed nonoperative management and presents for  total hip arthroplasty.   Laboratory Data: Admission on 02/04/2017  Component Date Value Ref Range Status  . Sodium 02/05/2017 134* 135 - 145 mmol/L Final  . Potassium 02/05/2017 4.2  3.5 - 5.1 mmol/L Final  . Chloride 02/05/2017 98* 101 - 111 mmol/L Final  . CO2 02/05/2017 27  22 - 32 mmol/L Final  . Glucose, Bld 02/05/2017 102* 65 - 99 mg/dL Final  . BUN 02/05/2017 18  6 - 20 mg/dL Final  . Creatinine, Ser 02/05/2017 1.04  0.61 - 1.24 mg/dL Final  . Calcium 02/05/2017 8.8* 8.9 - 10.3 mg/dL Final  . GFR calc non Af Amer 02/05/2017 >60  >60 mL/min Final  . GFR calc Af Amer 02/05/2017 >60   >60 mL/min Final   Comment: (NOTE) The eGFR has been calculated using the CKD EPI equation. This calculation has not been validated in all clinical situations. eGFR's persistently <60 mL/min signify possible Chronic Kidney Disease.   Georgiann Hahn gap 02/05/2017 9  5 - 15 Final  Hospital Outpatient Visit on 01/28/2017  Component Date Value Ref Range Status  . aPTT 01/28/2017 33  24 - 36 seconds Final  . WBC 01/28/2017 9.0  4.0 - 10.5 K/uL Final  . RBC 01/28/2017 4.83  4.22 - 5.81 MIL/uL Final  . Hemoglobin 01/28/2017 16.3  13.0 - 17.0 g/dL Final  . HCT 01/28/2017 46.0  39.0 - 52.0 % Final  . MCV 01/28/2017 95.2  78.0 - 100.0 fL Final  . MCH 01/28/2017 33.7  26.0 - 34.0 pg Final  . MCHC 01/28/2017 35.4  30.0 - 36.0 g/dL Final  . RDW 01/28/2017 12.0  11.5 - 15.5 % Final  . Platelets 01/28/2017 251  150 - 400 K/uL Final  . Sodium 01/28/2017 139  135 - 145 mmol/L Final  . Potassium 01/28/2017 4.0  3.5 - 5.1 mmol/L Final  . Chloride 01/28/2017 102  101 - 111 mmol/L Final  . CO2 01/28/2017 30  22 - 32 mmol/L Final  . Glucose, Bld 01/28/2017 111* 65 - 99 mg/dL Final  . BUN 01/28/2017  16  6 - 20 mg/dL Final  . Creatinine, Ser 01/28/2017 0.93  0.61 - 1.24 mg/dL Final  . Calcium 01/28/2017 9.4  8.9 - 10.3 mg/dL Final  . Total Protein 01/28/2017 7.4  6.5 - 8.1 g/dL Final  . Albumin 01/28/2017 4.4  3.5 - 5.0 g/dL Final  . AST 01/28/2017 26  15 - 41 U/L Final  . ALT 01/28/2017 25  17 - 63 U/L Final  . Alkaline Phosphatase 01/28/2017 57  38 - 126 U/L Final  . Total Bilirubin 01/28/2017 1.0  0.3 - 1.2 mg/dL Final  . GFR calc non Af Amer 01/28/2017 >60  >60 mL/min Final  . GFR calc Af Amer 01/28/2017 >60  >60 mL/min Final   Comment: (NOTE) The eGFR has been calculated using the CKD EPI equation. This calculation has not been validated in all clinical situations. eGFR's persistently <60 mL/min signify possible Chronic Kidney Disease.   . Anion gap 01/28/2017 7  5 - 15 Final  . Prothrombin Time  01/28/2017 13.8  11.4 - 15.2 seconds Final  . INR 01/28/2017 1.06   Final  . ABO/RH(D) 01/28/2017 O POS   Final  . Antibody Screen 01/28/2017 NEG   Final  . Sample Expiration 01/28/2017 02/07/2017   Final  . Extend sample reason 01/28/2017 NO TRANSFUSIONS OR PREGNANCY IN THE PAST 3 MONTHS   Final  . MRSA, PCR 01/28/2017 NEGATIVE  NEGATIVE Final  . Staphylococcus aureus 01/28/2017 POSITIVE* NEGATIVE Final   Comment:        The Xpert SA Assay (FDA approved for NASAL specimens in patients over 60 years of age), is one component of a comprehensive surveillance program.  Test performance has been validated by [facility] for patients greater than or equal to 57 year old. It is not intended to diagnose infection nor to guide or monitor treatment.      X-Rays:Dg Pelvis Portable  Result Date: 02/04/2017 CLINICAL DATA:  Status post right total hip replacement EXAM: PORTABLE PELVIS 1-2 VIEWS COMPARISON:  January 24, 2009 FINDINGS: Frontal lower pelvic image including hips obtained. There is now a total hip prosthesis on the right with prosthetic components well-seated. A surgical drain and soft tissue air noted on the right. Patient has had a previous total hip replacement on the left with prosthetic components well-seated. There is myositis ossificans lateral to the left hip joint, stable. No acute fracture or dislocation. IMPRESSION: Bilateral total hip replacements with prosthetic components well-seated bilaterally. Surgical drain and postoperative air noted on the right. Myositis ossificans lateral to the left hip joint is stable. No acute fracture or dislocation. Electronically Signed   By: Lowella Grip III M.D.   On: 02/04/2017 12:53   Dg C-arm 1-60 Min-no Report  Result Date: 02/04/2017 Fluoroscopy was utilized by the requesting physician.  No radiographic interpretation.    EKG:No orders found for this or any previous visit.   Hospital Course: Patient was admitted to [facility] and taken to the OR and underwent the above state procedure without complications.  Patient tolerated the procedure well and was later transferred to the recovery room and then to the orthopaedic floor for postoperative care.  They were given PO and IV analgesics for pain control following their surgery.  They were given 24 hours of postoperative antibiotics of  Anti-infectives    Start     Dose/Rate Route Frequency Ordered Stop   02/04/17 1700  ceFAZolin (ANCEF) IVPB 2g/100 mL premix     2 g  200 mL/hr over 30 Minutes Intravenous Every 6 hours 02/04/17 1343 02/04/17 2247   02/04/17 0917  ceFAZolin (ANCEF) IVPB 2g/100 mL premix     2 g 200 mL/hr over 30 Minutes Intravenous On call to O.R. 02/04/17 1017 02/04/17 1043     and started on DVT prophylaxis in the form of Xarelto.   PT and OT were ordered for total hip protocol.  The patient was allowed to be WBAT with therapy. Discharge planning was consulted to help with postop disposition and equipment needs. **He will need a platform walker for the right arm for the next several weeks.  He had a cardiac cath with a radial artery approached so keep pressure off right wrist.**  Patient had a good night on the evening of surgery.  They started to get up OOB with therapy on day one.  Hemovac drain was pulled without difficulty.  Continued to work with therapy into day two.  Dressing was changed on day two and the incision was healing well.   Patient was seen in rounds and was ready to go home.  Discharge home with home health Diet - Cardiac diet Follow up - in 2 weeks Activity - WBAT Disposition - Home Condition Upon Discharge - Good D/C Meds - See DC Summary DVT Prophylaxis - Xarelto  Discharge Instructions    Call MD / Call 911    Complete by:  As directed    If you experience chest pain or shortness of breath, CALL 911 and be transported to the hospital emergency room.  If you develope a fever above 101 F, pus (white drainage) or  increased drainage or redness at the wound, or calf pain, call your surgeon's office.   Change dressing    Complete by:  As directed    You may change your dressing dressing daily with sterile 4 x 4 inch gauze dressing and paper tape.  Do not submerge the incision under water.   Constipation Prevention    Complete by:  As directed    Drink plenty of fluids.  Prune juice may be helpful.  You may use a stool softener, such as Colace (over the counter) 100 mg twice a day.  Use MiraLax (over the counter) for constipation as needed.   Diet - low sodium heart healthy    Complete by:  As directed    Discharge instructions    Complete by:  As directed    Pick up stool softner and laxative for home use following surgery while on pain medications. Do not submerge incision under water. Please use good hand washing techniques while changing dressing each day. May shower starting three days after surgery. Please use a clean towel to pat the incision dry following showers. Continue to use ice for pain and swelling after surgery. Do not use any lotions or creams on the incision until instructed by your surgeon.  Wear both TED hose on both legs during the day every day for three weeks, but may have off at night at home.  Postoperative Constipation Protocol  Constipation - defined medically as fewer than three stools per week and severe constipation as less than one stool per week.  One of the most common issues patients have following surgery is constipation.  Even if you have a regular bowel pattern at home, your normal regimen is likely to be disrupted due to multiple reasons following surgery.  Combination of anesthesia, postoperative narcotics, change in appetite and fluid intake all can affect your  bowels.  In order to avoid complications following surgery, here are some recommendations in order to help you during your recovery period.  Colace (docusate) - Pick up an over-the-counter form of Colace  or another stool softener and take twice a day as long as you are requiring postoperative pain medications.  Take with a full glass of water daily.  If you experience loose stools or diarrhea, hold the colace until you stool forms back up.  If your symptoms do not get better within 1 week or if they get worse, check with your doctor.  Dulcolax (bisacodyl) - Pick up over-the-counter and take as directed by the product packaging as needed to assist with the movement of your bowels.  Take with a full glass of water.  Use this product as needed if not relieved by Colace only.   MiraLax (polyethylene glycol) - Pick up over-the-counter to have on hand.  MiraLax is a solution that will increase the amount of water in your bowels to assist with bowel movements.  Take as directed and can mix with a glass of water, juice, soda, coffee, or tea.  Take if you go more than two days without a movement. Do not use MiraLax more than once per day. Call your doctor if you are still constipated or irregular after using this medication for 7 days in a row.  If you continue to have problems with postoperative constipation, please contact the office for further assistance and recommendations.  If you experience "the worst abdominal pain ever" or develop nausea or vomiting, please contact the office immediatly for further recommendations for treatment.   Take Xarelto for two and a half more weeks, then discontinue Xarelto. Once the patient has completed the blood thinner regimen, then take a Baby 81 mg Aspirin daily for three more weeks.   Do not sit on low chairs, stoools or toilet seats, as it may be difficult to get up from low surfaces    Complete by:  As directed    Driving restrictions    Complete by:  As directed    No driving until released by the physician.   Increase activity slowly as tolerated    Complete by:  As directed    Lifting restrictions    Complete by:  As directed    No lifting until released by  the physician.   Patient may shower    Complete by:  As directed    You may shower without a dressing once there is no drainage.  Do not wash over the wound.  If drainage remains, do not shower until drainage stops.   TED hose    Complete by:  As directed    Use stockings (TED hose) for 3 weeks on both leg(s).  You may remove them at night for sleeping.   Weight bearing as tolerated    Complete by:  As directed    Laterality:  right   Extremity:  Lower     Allergies as of 02/05/2017   No Known Allergies     Medication List    STOP taking these medications   meloxicam 7.5 MG tablet Commonly known as:  MOBIC   multivitamin with minerals Tabs tablet   OVER THE COUNTER MEDICATION   OVER THE COUNTER MEDICATION     TAKE these medications   atorvastatin 40 MG tablet Commonly known as:  LIPITOR Take 40 mg by mouth daily at 6 PM.   losartan-hydrochlorothiazide 50-12.5 MG tablet Commonly known as:  HYZAAR Take 1 tablet by mouth daily.   methocarbamol 500 MG tablet Commonly known as:  ROBAXIN Take 1 tablet (500 mg total) by mouth every 6 (six) hours as needed for muscle spasms.   omeprazole 40 MG capsule Commonly known as:  PRILOSEC Take 40 mg by mouth 2 (two) times daily.   oxyCODONE 5 MG immediate release tablet Commonly known as:  Oxy IR/ROXICODONE Take 1-2 tablets (5-10 mg total) by mouth every 4 (four) hours as needed for moderate pain or severe pain.   Potassium 99 MG Tabs Take 99 mg by mouth every evening.   rivaroxaban 10 MG Tabs tablet Commonly known as:  XARELTO Take 1 tablet (10 mg total) by mouth daily with breakfast. Take Xarelto for two and a half more weeks following discharge from the hospital, then discontinue Xarelto. Once the patient has completed the blood thinner regimen, then take a Baby 81 mg Aspirin daily for three more weeks. Start taking on:  02/06/2017   tamsulosin 0.4 MG Caps capsule Commonly known as:  FLOMAX Take 0.4 mg by mouth 2 (two)  times daily.   traMADol 50 MG tablet Commonly known as:  ULTRAM Take 1-2 tablets (50-100 mg total) by mouth every 6 (six) hours as needed for moderate pain.      Follow-up Information    Gearlean Alf, MD. Schedule an appointment as soon as possible for a visit on 02/17/2017.   Specialty:  Orthopedic Surgery Contact information: [address] [place] [address] [phone]           Signed: Arlee Muslim, PA-C Orthopaedic Surgery 02/05/2017, 8:23 AM

## 2017-02-05 NOTE — Progress Notes (Signed)
Subjective: 1 Day Post-Op Procedure(s) (LRB): RIGHT TOTAL HIP ARTHROPLASTY ANTERIOR APPROACH (Right) Patient reports pain as mild and moderate.   Patient seen in rounds with Dr. Wynelle Link. Patient is well, but has had some minor complaints of pain in the hip, requiring pain medications We will start therapy today.  If they do well with therapy and meets all goals, then will allow home later this afternoon following therapy.  **He will need a platform walker for the right arm for the next several weeks.  He had a cardiac cath with a radial artery approached so keep pressure off right wrist.** Plan is to go Home after hospital stay.  If he does very well today and meets all goals, then possibly home later today.  Objective: Vital signs in last 24 hours: Temp:  [97.5 F (36.4 C)-98.3 F (36.8 C)] 98.1 F (36.7 C) (03/01 0630) Pulse Rate:  [75-109] 75 (03/01 0630) Resp:  [11-18] 16 (03/01 0630) BP: (119-155)/(61-97) 149/63 (03/01 0630) SpO2:  [85 %-100 %] 85 % (03/01 0630) Weight:  [100.2 kg (221 lb)] 100.2 kg (221 lb) (02/28 1330)  Intake/Output from previous day:  Intake/Output Summary (Last 24 hours) at 02/05/17 0808 Last data filed at 02/05/17 0656  Gross per 24 hour  Intake          4483.33 ml  Output             2737 ml  Net          1746.33 ml    Intake/Output this shift: No intake/output data recorded.  Labs: No results for input(s): HGB in the last 72 hours. No results for input(s): WBC, RBC, HCT, PLT in the last 72 hours.  Recent Labs  02/05/17 0402  NA 134*  K 4.2  CL 98*  CO2 27  BUN 18  CREATININE 1.04  GLUCOSE 102*  CALCIUM 8.8*   No results for input(s): LABPT, INR in the last 72 hours.  EXAM General - Patient is Alert, Appropriate and Oriented Extremity - Neurovascular intact Sensation intact distally Intact pulses distally Dorsiflexion/Plantar flexion intact Dressing - dressing C/D/I Motor Function - intact, moving foot and toes well on exam.    Hemovac pulled without difficulty.  Past Medical History:  Diagnosis Date  . Anemia   . Ankylosing spondylitis ([place])   . Arthritis 1974  . Barrett's esophagus 2004  . BPH (benign prostatic hyperplasia)   . Complication of anesthesia    LOWER EXTREMITY SLOW TO AWAKEN   . Diverticulosis   . DJD (degenerative joint disease)    CERVICAL  . GERD (gastroesophageal reflux disease)   . Hiatal hernia   . Hyperlipidemia   . Hypertension   . Nose abrasion    PRE CANCEROUS AREA LEFT SIDE OF NOSE, ABRASION RIGHT LOWER LEG HEALING  . Sleep disturbance     Assessment/Plan: 1 Day Post-Op Procedure(s) (LRB): RIGHT TOTAL HIP ARTHROPLASTY ANTERIOR APPROACH (Right) Principal Problem:   OA (osteoarthritis) of hip  Estimated body mass index is 30.82 kg/m as calculated from the following:   Height as of this encounter: 5\' 11"  (1.803 m).   Weight as of this encounter: 100.2 kg (221 lb). Advance diet Up with therapy Discharge home with home health  DVT Prophylaxis - Xarelto Weight Bearing As Tolerated right Leg Hemovac Pulled Begin Therapy  If meets goals and able to go home: Discharge home with home health Diet - Cardiac diet Follow up - in 2 weeks Activity - WBAT to right leg  Disposition - Home Condition Upon Discharge - pending therapy results D/C Meds - See DC Summary DVT Prophylaxis - Xarelto  Arlee Muslim, PA-C Orthopaedic Surgery 02/05/2017, 8:08 AM

## 2017-02-05 NOTE — Evaluation (Signed)
Physical Therapy Evaluation Patient Details Name: [name]. MRN: [id number] DOB: December 24, 1933 Today's Date: 02/05/2017   History of Present Illness  Pt is an 83 year old male s/p R direct anterior THA with hx of L THA with multiple left hip surgeries as well as recent cardiac cath with R radial artery access  Clinical Impression  Pt admitted with above diagnosis. Pt currently with functional limitations due to the deficits listed below (see PT Problem List).  Pt will benefit from skilled PT to increase their independence and safety with mobility to allow discharge to the venue listed below.  Pt mobilizing well POD #1 and plans to d/c home with family assist and start OP PT on Monday.      Follow Up Recommendations Outpatient PT;Supervision for mobility/OOB (pt plans for OPPT)    Equipment Recommendations  Rolling walker with 5" wheels (R platform)    Recommendations for Other Services       Precautions / Restrictions Precautions Precautions: Fall Restrictions Weight Bearing Restrictions: Yes RUE Weight Bearing: Weight bear through elbow only Other Position/Activity Restrictions: per orders - limit weight bearing on R wrist due to recent cardiac cath, R hip WBAT      Mobility  Bed Mobility Overal bed mobility: Needs Assistance Bed Mobility: Supine to Sit     Supine to sit: Min assist     General bed mobility comments: assist for R LE  Transfers Overall transfer level: Needs assistance Equipment used: Right platform walker Transfers: Sit to/from Stand Sit to Stand: Min assist         General transfer comment: verbal cues for UE and LE positioning, assist to rise and steady  Ambulation/Gait Ambulation/Gait assistance: Min guard Ambulation Distance (Feet): 120 Feet Assistive device: Right platform walker Gait Pattern/deviations: Step-to pattern;Decreased stance time - right;Antalgic;Trunk flexed     General Gait Details: verbal cues for safe use of PFRW, step  length, posture; pt reports pain controlled, distance to tolerance  Stairs            Wheelchair Mobility    Modified Rankin (Stroke Patients Only)       Balance                                             Pertinent Vitals/Pain Pain Assessment: 0-10 Pain Score: 3  Pain Location: R hip Pain Descriptors / Indicators: Aching;Sore Pain Intervention(s): Limited activity within patient's tolerance;Monitored during session;Repositioned;Ice applied    Home Living Family/patient expects to be discharged to:: Private residence Living Arrangements: Spouse/significant other Available Help at Discharge: Family Type of Home: House Home Access: Stairs to enter   Technical brewer of Steps: 3 Home Layout: Able to live on main level with bedroom/bathroom Home Equipment: Walker - 2 wheels;Walker - 4 wheels      Prior Function Level of Independence: Independent               Hand Dominance        Extremity/Trunk Assessment        Lower Extremity Assessment Lower Extremity Assessment: RLE deficits/detail RLE Deficits / Details: anticipated post op hip weakness       Communication   Communication: HOH  Cognition Arousal/Alertness: Awake/alert Behavior During Therapy: WFL for tasks assessed/performed Overall Cognitive Status: Within Functional Limits for tasks assessed  General Comments      Exercises     Assessment/Plan    PT Assessment Patient needs continued PT services  PT Problem List Decreased strength;Decreased mobility;Decreased knowledge of use of DME;Pain       PT Treatment Interventions Functional mobility training;Stair training;Gait training;DME instruction;Therapeutic activities;Therapeutic exercise;Patient/family education    PT Goals (Current goals can be found in the Care Plan section)  Acute Rehab PT Goals PT Goal Formulation: With patient/family Time For Goal Achievement:  02/12/17 Potential to Achieve Goals: Good    Frequency 7X/week   Barriers to discharge        Co-evaluation               End of Session Equipment Utilized During Treatment: Gait belt Activity Tolerance: Patient tolerated treatment well Patient left: in chair;with call bell/phone within reach;with family/visitor present Nurse Communication: Mobility status PT Visit Diagnosis: Other abnormalities of gait and mobility (R26.89);Pain Pain - Right/Left: Right Pain - part of body: Hip         Time: [id number] PT Time Calculation (min) (ACUTE ONLY): 24 min   Charges:   PT Evaluation $PT Eval Low Complexity: 1 Procedure     PT G Codes:         Marlina Cataldi,KATHrine E 02/05/2017, 12:48 PM Carmelia Bake, PT, DPT 02/05/2017 Pager: [phone]

## 2017-02-05 NOTE — Progress Notes (Signed)
   02/05/17 1555  PT Visit Information  Last PT Received On 02/05/17  Assistance Needed +1  History of Present Illness Pt is an 83 year old male s/p R direct anterior THA with hx of L THA with multiple left hip surgeries as well as recent cardiac cath with R radial artery access  Subjective Data  Subjective Pt performed LE exercises in bed and ambulated in hallway again.  Pt would benefit from more practice using [place] prior to d/c home, also needs to practice steps.  Precautions  Precautions Fall  Restrictions  RUE Weight Bearing Weight bear through elbow only  Other Position/Activity Restrictions per orders - limit weight bearing on R wrist due to recent cardiac cath, R hip WBAT  Pain Assessment  Pain Assessment 0-10  Pain Score 4  Pain Location R hip  Pain Descriptors / Indicators Cramping;Sore  Pain Intervention(s) Limited activity within patient's tolerance;Monitored during session;Repositioned  Cognition  Arousal/Alertness Awake/alert  Behavior During Therapy WFL for tasks assessed/performed  Overall Cognitive Status Within Functional Limits for tasks assessed  Bed Mobility  Overal bed mobility Needs Assistance  Bed Mobility Supine to Sit;Sit to Supine  Supine to sit Min guard  Sit to supine Min guard  General bed mobility comments pt declined assist for R LE, however requires increased time and very effortful bed mobility  Transfers  Overall transfer level Needs assistance  Equipment used Right platform walker  Transfers Sit to/from Stand  Sit to Stand Min guard  General transfer comment verbal cues for UE and LE positioning  Ambulation/Gait  Ambulation/Gait assistance Min guard  Ambulation Distance (Feet) 120 Feet  Assistive device Right platform walker  Gait Pattern/deviations Step-to pattern;Decreased stance time - right;Antalgic;Trunk flexed  General Gait Details moderate verbal cues for safe use of PFRW, step length, posture  Exercises  Exercises Total Joint   Total Joint Exercises  Ankle Circles/Pumps AROM;Both;5 reps  Quad Sets AROM;Right;10 reps  Short Arc Quad AROM;Right;10 reps  Heel Slides AAROM;Right;10 reps  Hip ABduction/ADduction AAROM;Right;10 reps  PT - End of Session  Activity Tolerance Patient tolerated treatment well  Patient left in bed;with call bell/phone within reach;with family/visitor present  PT - Assessment/Plan  PT Plan Current plan remains appropriate  PT Visit Diagnosis Other abnormalities of gait and mobility (R26.89);Pain  Pain - Right/Left Right  Pain - part of body Hip  PT Frequency (ACUTE ONLY) 7X/week  Follow Up Recommendations Outpatient PT;Supervision for mobility/OOB  PT equipment Other (comment) (platform in room)  AM-PAC PT "6 Clicks" Daily Activity Outcome Measure  Difficulty turning over in bed (including adjusting bedclothes, sheets and blankets)? 3  Difficulty moving from lying on back to sitting on the side of the bed?  3  Difficulty sitting down on and standing up from a chair with arms (e.g., wheelchair, bedside commode, etc,.)? 3  Help needed moving to and from a bed to chair (including a wheelchair)? 3  Help needed walking in hospital room? 3  Help needed climbing 3-5 steps with a railing?  3  6 Click Score 18  Mobility G Code  CK  PT Goal Progression  Progress towards PT goals Progressing toward goals  PT Time Calculation  PT Start Time (ACUTE ONLY) 1454  PT Stop Time (ACUTE ONLY) 1515  PT Time Calculation (min) (ACUTE ONLY) 21 min  PT General Charges  $$ ACUTE PT VISIT 1 Procedure  PT Treatments  $Therapeutic Exercise 8-22 mins   Carmelia Bake, PT, DPT 02/05/2017 Pager: [phone]

## 2017-02-06 LAB — CBC
HEMATOCRIT: 34.3 % — AB (ref 39.0–52.0)
HEMOGLOBIN: 11.7 g/dL — AB (ref 13.0–17.0)
MCH: 32.2 pg (ref 26.0–34.0)
MCHC: 34.1 g/dL (ref 30.0–36.0)
MCV: 94.5 fL (ref 78.0–100.0)
Platelets: 229 10*3/uL (ref 150–400)
RBC: 3.63 MIL/uL — ABNORMAL LOW (ref 4.22–5.81)
RDW: 12.1 % (ref 11.5–15.5)
WBC: 13.8 10*3/uL — AB (ref 4.0–10.5)

## 2017-02-06 LAB — BASIC METABOLIC PANEL
ANION GAP: 7 (ref 5–15)
BUN: 19 mg/dL (ref 6–20)
CALCIUM: 9.1 mg/dL (ref 8.9–10.3)
CHLORIDE: 100 mmol/L — AB (ref 101–111)
CO2: 28 mmol/L (ref 22–32)
CREATININE: 0.87 mg/dL (ref 0.61–1.24)
GFR calc Af Amer: >60 mL/min (ref >60)
GFR calc non Af Amer: >60 mL/min (ref >60)
Glucose, Bld: 115 mg/dL — ABNORMAL HIGH (ref 65–99)
Potassium: 3.9 mmol/L (ref 3.5–5.1)
SODIUM: 135 mmol/L (ref 135–145)

## 2017-02-06 NOTE — Progress Notes (Signed)
Physical Therapy Treatment Patient Details Name: [name]. MRN: [id number] DOB: [birth year] Today's Date: 02/06/2017    History of Present Illness Pt is an 83 year old male s/p R direct anterior THA with hx of L THA with multiple left hip surgeries as well as recent cardiac cath with R radial artery access    PT Comments    Pt still requiring cues for less wrist WBing on R UE however family aware to cue pt as needed.  Pt improving with using PFRW.  Pt also practiced safe stair technique twice, once with therapist and then with family member.  Pt performed LE exercises and provided with HEP handout.  Answered pt and family questions within scope of practice, and pt feels ready for d/c home today.   Follow Up Recommendations  Outpatient PT;Supervision for mobility/OOB     Equipment Recommendations  Other (comment) (equipment delivered)    Recommendations for Other Services       Precautions / Restrictions Precautions Precautions: Fall Restrictions Weight Bearing Restrictions: No RUE Weight Bearing: Weight bearing as tolerated Other Position/Activity Restrictions: per orders - limit weight bearing on R wrist due to recent cardiac cath, R hip WBAT    Mobility  Bed Mobility Overal bed mobility: Needs Assistance Bed Mobility: Supine to Sit     Supine to sit: Supervision     General bed mobility comments: pt declined assist for R LE, however requires increased time and very effortful bed mobility  Transfers Overall transfer level: Needs assistance Equipment used: Right platform walker Transfers: Sit to/from Stand Sit to Stand: Min guard;Min assist         General transfer comment: verbal cues for UE and LE positioning, assist required for boost off low bed surface, improved to min/guard with recliner (use of armrests)  Ambulation/Gait Ambulation/Gait assistance: Min guard Ambulation Distance (Feet): 120 Feet Assistive device: Right platform walker Gait  Pattern/deviations: Step-to pattern;Decreased stance time - right;Antalgic;Trunk flexed     General Gait Details: moderate verbal cues for safe use of PFRW, sequence, step length, posture   Stairs Stairs: Yes   Stair Management: Step to pattern;Backwards;One rail Right Number of Stairs: 3 General stair comments: pt has rail on right side so performed backwards so pt could use rail with left hand and had person in front for pt to prop forearm on their shoulder, performed once with therapist in front and then family member, both report understanding of technique   Wheelchair Mobility    Modified Rankin (Stroke Patients Only)       Balance                                    Cognition Arousal/Alertness: Awake/alert Behavior During Therapy: WFL for tasks assessed/performed Overall Cognitive Status: Within Functional Limits for tasks assessed                      Exercises Total Joint Exercises Ankle Circles/Pumps: AROM;Both;10 reps Quad Sets: AROM;Right;10 reps Towel Squeeze: AROM;Both;10 reps Short Arc Quad: AROM;Right;10 reps Heel Slides: AAROM;Right;10 reps Hip ABduction/ADduction: AAROM;Right;10 reps    General Comments        Pertinent Vitals/Pain Pain Assessment: 0-10 Pain Score: 4  Pain Location: R hip Pain Descriptors / Indicators: Sore;Tightness Pain Intervention(s): Limited activity within patient's tolerance;Monitored during session;Repositioned;Ice applied    Home Living  Prior Function            PT Goals (current goals can now be found in the care plan section) Progress towards PT goals: Progressing toward goals    Frequency    7X/week      PT Plan Current plan remains appropriate    Co-evaluation             End of Session Equipment Utilized During Treatment: Gait belt Activity Tolerance: Patient tolerated treatment well Patient left: with call bell/phone within reach;with  family/visitor present;in chair Nurse Communication: Mobility status PT Visit Diagnosis: Other abnormalities of gait and mobility (R26.89);Pain Pain - Right/Left: Right Pain - part of body: Hip     Time: [id number] PT Time Calculation (min) (ACUTE ONLY): 38 min  Charges:  $Gait Training: 8-22 mins $Therapeutic Exercise: 8-22 mins                    G Codes:       Reisha Wos,KATHrine E 02/06/2017, 11:49 AM Carmelia Bake, PT, DPT 02/06/2017 Pager: [phone]

## 2017-02-06 NOTE — Progress Notes (Signed)
   Subjective: 2 Days Post-Op Procedure(s) (LRB): RIGHT TOTAL HIP ARTHROPLASTY ANTERIOR APPROACH (Right) Patient reports pain as mild.   Patient seen in rounds with Dr. Wynelle Link. Wife in room at bedside. Patient is well, but has had some minor complaints of pain in the hip, requiring pain medications Patient is ready to go home later today.  Objective: Vital signs in last 24 hours: Temp:  [97.8 F (36.6 C)-99.6 F (37.6 C)] 97.8 F (36.6 C) (03/02 0536) Pulse Rate:  [74-93] 76 (03/02 0536) Resp:  [16] 16 (03/02 0536) BP: (127-149)/(55-69) 149/63 (03/02 0536) SpO2:  [93 %-98 %] 93 % (03/02 0536)  Intake/Output from previous day:  Intake/Output Summary (Last 24 hours) at 02/06/17 0752 Last data filed at 02/06/17 0600  Gross per 24 hour  Intake          1341.33 ml  Output             2950 ml  Net         -1608.67 ml    Intake/Output this shift: No intake/output data recorded.  Labs:  Recent Labs  02/05/17 0402 02/06/17 0507  HGB 12.4* 11.7*    Recent Labs  02/05/17 0402 02/06/17 0507  WBC 17.5* 13.8*  RBC 3.86* 3.63*  HCT 36.4* 34.3*  PLT 271 229    Recent Labs  02/05/17 0402 02/06/17 0507  NA 134* 135  K 4.2 3.9  CL 98* 100*  CO2 27 28  BUN 18 19  CREATININE 1.04 0.87  GLUCOSE 102* 115*  CALCIUM 8.8* 9.1   No results for input(s): LABPT, INR in the last 72 hours.  EXAM: General - Patient is Alert, Appropriate and Oriented Extremity - Neurovascular intact Sensation intact distally Intact pulses distally Dorsiflexion/Plantar flexion intact Incision - clean, dry, no drainage Motor Function - intact, moving foot and toes well on exam.   Assessment/Plan: 2 Days Post-Op Procedure(s) (LRB): RIGHT TOTAL HIP ARTHROPLASTY ANTERIOR APPROACH (Right) Procedure(s) (LRB): RIGHT TOTAL HIP ARTHROPLASTY ANTERIOR APPROACH (Right) Past Medical History:  Diagnosis Date  . Anemia   . Ankylosing spondylitis ([place])   . Arthritis 1974  . Barrett's esophagus  2004  . BPH (benign prostatic hyperplasia)   . Complication of anesthesia    LOWER EXTREMITY SLOW TO AWAKEN   . Diverticulosis   . DJD (degenerative joint disease)    CERVICAL  . GERD (gastroesophageal reflux disease)   . Hiatal hernia   . Hyperlipidemia   . Hypertension   . Nose abrasion    PRE CANCEROUS AREA LEFT SIDE OF NOSE, ABRASION RIGHT LOWER LEG HEALING  . Sleep disturbance    Principal Problem:   OA (osteoarthritis) of hip  Estimated body mass index is 30.82 kg/m as calculated from the following:   Height as of this encounter: 5\' 11"  (1.803 m).   Weight as of this encounter: 100.2 kg (221 lb). Up with therapy Discharge home with home health Diet - Cardiac diet Follow up - in 2 weeks Activity - WBAT Disposition - Home Condition Upon Discharge - Good D/C Meds - See DC Summary DVT Prophylaxis - [place], PA-C Orthopaedic Surgery 02/06/2017, 7:52 AM

## 2017-02-07 DIAGNOSIS — Z471 Aftercare following joint replacement surgery: Secondary | ICD-10-CM | POA: Diagnosis not present

## 2017-02-07 DIAGNOSIS — M169 Osteoarthritis of hip, unspecified: Secondary | ICD-10-CM | POA: Diagnosis not present

## 2017-02-07 DIAGNOSIS — K2270 Barrett's esophagus without dysplasia: Secondary | ICD-10-CM | POA: Diagnosis not present

## 2017-02-07 DIAGNOSIS — M1991 Primary osteoarthritis, unspecified site: Secondary | ICD-10-CM | POA: Diagnosis not present

## 2017-02-07 DIAGNOSIS — I739 Peripheral vascular disease, unspecified: Secondary | ICD-10-CM | POA: Diagnosis not present

## 2017-02-07 DIAGNOSIS — I10 Essential (primary) hypertension: Secondary | ICD-10-CM | POA: Diagnosis not present

## 2017-02-07 DIAGNOSIS — D649 Anemia, unspecified: Secondary | ICD-10-CM | POA: Diagnosis not present

## 2017-02-09 ENCOUNTER — Emergency Department (HOSPITAL_COMMUNITY): Payer: Medicare HMO

## 2017-02-09 ENCOUNTER — Encounter (HOSPITAL_COMMUNITY): Payer: Self-pay | Admitting: Emergency Medicine

## 2017-02-09 ENCOUNTER — Inpatient Hospital Stay (HOSPITAL_COMMUNITY): Payer: Medicare HMO

## 2017-02-09 ENCOUNTER — Inpatient Hospital Stay (HOSPITAL_COMMUNITY)
Admission: EM | Admit: 2017-02-09 | Discharge: 2017-02-13 | DRG: 640 | Disposition: A | Discharge status: Home Health | Payer: Medicare HMO | Attending: Family Medicine | Admitting: Family Medicine

## 2017-02-09 DIAGNOSIS — K219 Gastro-esophageal reflux disease without esophagitis: Secondary | ICD-10-CM | POA: Diagnosis present

## 2017-02-09 DIAGNOSIS — I517 Cardiomegaly: Secondary | ICD-10-CM | POA: Diagnosis not present

## 2017-02-09 DIAGNOSIS — R350 Frequency of micturition: Secondary | ICD-10-CM | POA: Diagnosis present

## 2017-02-09 DIAGNOSIS — I447 Left bundle-branch block, unspecified: Secondary | ICD-10-CM | POA: Diagnosis not present

## 2017-02-09 DIAGNOSIS — Z87891 Personal history of nicotine dependence: Secondary | ICD-10-CM | POA: Diagnosis not present

## 2017-02-09 DIAGNOSIS — G9340 Encephalopathy, unspecified: Secondary | ICD-10-CM | POA: Diagnosis present

## 2017-02-09 DIAGNOSIS — J189 Pneumonia, unspecified organism: Secondary | ICD-10-CM | POA: Diagnosis not present

## 2017-02-09 DIAGNOSIS — Z79899 Other long term (current) drug therapy: Secondary | ICD-10-CM

## 2017-02-09 DIAGNOSIS — D72829 Elevated white blood cell count, unspecified: Secondary | ICD-10-CM | POA: Diagnosis present

## 2017-02-09 DIAGNOSIS — Z96643 Presence of artificial hip joint, bilateral: Secondary | ICD-10-CM | POA: Diagnosis present

## 2017-02-09 DIAGNOSIS — J90 Pleural effusion, not elsewhere classified: Secondary | ICD-10-CM | POA: Diagnosis not present

## 2017-02-09 DIAGNOSIS — E871 Hypo-osmolality and hyponatremia: Secondary | ICD-10-CM | POA: Diagnosis not present

## 2017-02-09 DIAGNOSIS — M459 Ankylosing spondylitis of unspecified sites in spine: Secondary | ICD-10-CM | POA: Diagnosis present

## 2017-02-09 DIAGNOSIS — E785 Hyperlipidemia, unspecified: Secondary | ICD-10-CM | POA: Diagnosis not present

## 2017-02-09 DIAGNOSIS — J690 Pneumonitis due to inhalation of food and vomit: Secondary | ICD-10-CM | POA: Diagnosis present

## 2017-02-09 DIAGNOSIS — D649 Anemia, unspecified: Secondary | ICD-10-CM | POA: Diagnosis not present

## 2017-02-09 DIAGNOSIS — K2270 Barrett's esophagus without dysplasia: Secondary | ICD-10-CM | POA: Diagnosis present

## 2017-02-09 DIAGNOSIS — Z471 Aftercare following joint replacement surgery: Secondary | ICD-10-CM | POA: Diagnosis not present

## 2017-02-09 DIAGNOSIS — G9341 Metabolic encephalopathy: Secondary | ICD-10-CM | POA: Diagnosis present

## 2017-02-09 DIAGNOSIS — I10 Essential (primary) hypertension: Secondary | ICD-10-CM | POA: Diagnosis not present

## 2017-02-09 DIAGNOSIS — R4182 Altered mental status, unspecified: Secondary | ICD-10-CM | POA: Diagnosis not present

## 2017-02-09 DIAGNOSIS — N400 Enlarged prostate without lower urinary tract symptoms: Secondary | ICD-10-CM | POA: Diagnosis present

## 2017-02-09 DIAGNOSIS — I739 Peripheral vascular disease, unspecified: Secondary | ICD-10-CM | POA: Diagnosis not present

## 2017-02-09 DIAGNOSIS — M1991 Primary osteoarthritis, unspecified site: Secondary | ICD-10-CM | POA: Diagnosis not present

## 2017-02-09 DIAGNOSIS — R9431 Abnormal electrocardiogram [ECG] [EKG]: Secondary | ICD-10-CM | POA: Diagnosis present

## 2017-02-09 LAB — CBC WITH DIFFERENTIAL/PLATELET
BASOS ABS: 0 10*3/uL (ref 0.0–0.1)
Basophils Relative: 0 %
Eosinophils Absolute: 0 10*3/uL (ref 0.0–0.7)
Eosinophils Relative: 0 %
HCT: 29.4 % — ABNORMAL LOW (ref 39.0–52.0)
Hemoglobin: 10.8 g/dL — ABNORMAL LOW (ref 13.0–17.0)
LYMPHS ABS: 1.9 10*3/uL (ref 0.7–4.0)
Lymphocytes Relative: 10 %
MCH: 32.7 pg (ref 26.0–34.0)
MCHC: 36.7 g/dL — ABNORMAL HIGH (ref 30.0–36.0)
MCV: 89.1 fL (ref 78.0–100.0)
MONO ABS: 1.7 10*3/uL — AB (ref 0.1–1.0)
Monocytes Relative: 9 %
NEUTROS PCT: 81 %
Neutro Abs: 14.9 10*3/uL — ABNORMAL HIGH (ref 1.7–7.7)
PLATELETS: 302 10*3/uL (ref 150–400)
RBC: 3.3 MIL/uL — AB (ref 4.22–5.81)
RDW: 11.5 % (ref 11.5–15.5)
WBC: 18.5 10*3/uL — AB (ref 4.0–10.5)

## 2017-02-09 LAB — COMPREHENSIVE METABOLIC PANEL
ALBUMIN: 3.8 g/dL (ref 3.5–5.0)
ALT: 24 U/L (ref 17–63)
AST: 50 U/L — AB (ref 15–41)
Alkaline Phosphatase: 57 U/L (ref 38–126)
Anion gap: 9 (ref 5–15)
BUN: 15 mg/dL (ref 6–20)
CHLORIDE: 83 mmol/L — AB (ref 101–111)
CO2: 25 mmol/L (ref 22–32)
CREATININE: 0.72 mg/dL (ref 0.61–1.24)
Calcium: 8.6 mg/dL — ABNORMAL LOW (ref 8.9–10.3)
GFR calc Af Amer: >60 mL/min (ref >60)
GFR calc non Af Amer: >60 mL/min (ref >60)
GLUCOSE: 125 mg/dL — AB (ref 65–99)
POTASSIUM: 4 mmol/L (ref 3.5–5.1)
Sodium: 117 mmol/L — CL (ref 135–145)
Total Bilirubin: 1.5 mg/dL — ABNORMAL HIGH (ref 0.3–1.2)
Total Protein: 6.7 g/dL (ref 6.5–8.1)

## 2017-02-09 LAB — URINALYSIS, ROUTINE W REFLEX MICROSCOPIC
Bilirubin Urine: NEGATIVE
GLUCOSE, UA: NEGATIVE mg/dL
HGB URINE DIPSTICK: NEGATIVE
Ketones, ur: NEGATIVE mg/dL
Leukocytes, UA: NEGATIVE
Nitrite: NEGATIVE
PH: 7 (ref 5.0–8.0)
Protein, ur: NEGATIVE mg/dL
SPECIFIC GRAVITY, URINE: 1.014 (ref 1.005–1.030)

## 2017-02-09 LAB — CBG MONITORING, ED: GLUCOSE-CAPILLARY: 139 mg/dL — AB (ref 65–99)

## 2017-02-09 MED ORDER — FAMOTIDINE IN NACL 20-0.9 MG/50ML-% IV SOLN
20.0000 mg | Freq: Two times a day (BID) | INTRAVENOUS | Status: DC
  Administered 2017-02-09 – 2017-02-12 (×7): 20 mg via INTRAVENOUS
  Filled 2017-02-09 (×9): qty 50

## 2017-02-09 MED ORDER — SODIUM CHLORIDE 0.9% FLUSH
3.0000 mL | Freq: Two times a day (BID) | INTRAVENOUS | Status: DC
  Administered 2017-02-09 – 2017-02-12 (×4): 3 mL via INTRAVENOUS

## 2017-02-09 MED ORDER — LORAZEPAM 2 MG/ML IJ SOLN
1.0000 mg | Freq: Once | INTRAMUSCULAR | Status: DC
  Filled 2017-02-09: qty 1

## 2017-02-09 MED ORDER — HYDRALAZINE HCL 20 MG/ML IJ SOLN: 5.0000 mg | INTRAMUSCULAR | Status: DC | PRN

## 2017-02-09 MED ORDER — OXYCODONE-ACETAMINOPHEN 5-325 MG PO TABS
1.0000 | ORAL_TABLET | Freq: Once | ORAL | Status: AC
  Administered 2017-02-09: 1 via ORAL
  Filled 2017-02-09: qty 1

## 2017-02-09 MED ORDER — ACETAMINOPHEN 650 MG RE SUPP: 650.0000 mg | Freq: Four times a day (QID) | RECTAL | Status: DC | PRN

## 2017-02-09 MED ORDER — ENOXAPARIN SODIUM 40 MG/0.4ML ~~LOC~~ SOLN
40.0000 mg | SUBCUTANEOUS | Status: DC
  Administered 2017-02-11 – 2017-02-13 (×3): 40 mg via SUBCUTANEOUS
  Filled 2017-02-09 (×5): qty 0.4

## 2017-02-09 MED ORDER — SODIUM CHLORIDE 0.9 % IV BOLUS (SEPSIS)
1000.0000 mL | Freq: Once | INTRAVENOUS | Status: AC
  Administered 2017-02-09: 1000 mL via INTRAVENOUS

## 2017-02-09 MED ORDER — MORPHINE SULFATE (PF) 4 MG/ML IV SOLN: 1.0000 mg | INTRAVENOUS | Status: DC | PRN

## 2017-02-09 MED ORDER — HYDROXYZINE HCL 50 MG/ML IM SOLN: 25.0000 mg | Freq: Four times a day (QID) | INTRAMUSCULAR | Status: DC | PRN

## 2017-02-09 MED ORDER — ACETAMINOPHEN 325 MG PO TABS
650.0000 mg | ORAL_TABLET | Freq: Four times a day (QID) | ORAL | Status: DC | PRN
  Administered 2017-02-10: 650 mg via ORAL
  Filled 2017-02-09: qty 2

## 2017-02-09 MED ORDER — SODIUM CHLORIDE 0.9 % IV SOLN
INTRAVENOUS | Status: DC
  Administered 2017-02-09 – 2017-02-11 (×3): via INTRAVENOUS

## 2017-02-09 MED ORDER — METHOCARBAMOL 500 MG PO TABS
500.0000 mg | ORAL_TABLET | Freq: Once | ORAL | Status: AC
  Administered 2017-02-09: 500 mg via ORAL
  Filled 2017-02-09: qty 1

## 2017-02-09 MED ORDER — LORAZEPAM 2 MG/ML IJ SOLN
1.0000 mg | Freq: Once | INTRAMUSCULAR | Status: AC
  Administered 2017-02-09: 1 mg via INTRAMUSCULAR

## 2017-02-09 NOTE — ED Notes (Signed)
Patient transported to MRI 

## 2017-02-09 NOTE — ED Provider Notes (Signed)
[facility] DEPT Provider Note   CSN: [id number] Arrival date & time: 02/09/17  1601     History   Chief Complaint Chief Complaint  Patient presents with  . Altered Mental Status    HPI [name]. is a 83 y.o. male.  83 year old male presents with 24-hour history of confusion. Patient recently had hip surgery but has been off of his pain medication muscle relaxant. No reported history of trauma. Family states that the patient had some repetitive behavior and did not seem oriented which was worse yesterday evening. Symptoms gradually improved but remained this morning. Went to physical therapy was able to dissipate but thereafter became tired became more confused. No speech changes. He did not seem oriented to place. No weakness of the arms or legs noted. No prior history of same. No treatment use prior to arrival      Past Medical History:  Diagnosis Date  . Anemia   . Ankylosing spondylitis ([place])   . Arthritis 1974  . Barrett's esophagus 2004  . BPH (benign prostatic hyperplasia)   . Complication of anesthesia    LOWER EXTREMITY SLOW TO AWAKEN   . Diverticulosis   . DJD (degenerative joint disease)    CERVICAL  . GERD (gastroesophageal reflux disease)   . Hiatal hernia   . Hyperlipidemia   . Hypertension   . Nose abrasion    PRE CANCEROUS AREA LEFT SIDE OF NOSE, ABRASION RIGHT LOWER LEG HEALING  . Sleep disturbance     Patient Active Problem List   Diagnosis Date Noted  . OA (osteoarthritis) of hip 02/04/2017  . Abnormal nuclear stress test 02/02/2017  . LBBB (left bundle branch block) 02/02/2017  . Other peripheral vascular disease(443.89) 05/05/2012  . ANEMIA 01/31/2009  . DEPRESSION 01/31/2009  . HYPERTENSION 01/31/2009  . GERD 01/31/2009  . HIATAL HERNIA 01/31/2009  . DIVERTICULOSIS, COLON 01/31/2009  . DEGENERATIVE JOINT DISEASE 01/31/2009  . SPONDYLITIS, ANKYLOSING 01/31/2009  . Barrett's esophagus 01/31/2009  . BENIGN PROSTATIC HYPERTROPHY,  HX OF 01/31/2009    Past Surgical History:  Procedure Laterality Date  . COLONSCOPY     SEVERAL  . ENSOSCOPY     SEVERAL  . INGUINAL HERNIA REPAIR Bilateral 1997   x2  . JOINT REPLACEMENT Left    total left hip 1984  . LEFT HEART CATH AND CORONARY ANGIOGRAPHY N/A 02/03/2017   Procedure: Left Heart Cath and Coronary Angiography;  Surgeon: Adrian Prows, MD;  Location: [facility];  Service: Cardiovascular;  Laterality: N/A;  . TOE SURGERY Left    left great toe  . [place], 2009, 2010   left x4  . TOTAL HIP ARTHROPLASTY Right 02/04/2017   Procedure: RIGHT TOTAL HIP ARTHROPLASTY ANTERIOR APPROACH;  Surgeon: Gaynelle Arabian, MD;  Location: [facility];  Service: Orthopedics;  Laterality: Right;  requests 13mins  . UMBILICAL HERNIA REPAIR         Home Medications    Prior to Admission medications   Medication Sig Start Date End Date Taking? Authorizing Provider  atorvastatin (LIPITOR) 40 MG tablet Take 40 mg by mouth daily at 6 PM.     Historical Provider, MD  hydrochlorothiazide (HYDRODIURIL) 25 MG tablet Take 25 mg by mouth daily. 11/25/16   Historical Provider, MD  losartan-hydrochlorothiazide (HYZAAR) 100-12.5 MG tablet Take 1 tablet by mouth daily. 01/30/17   Historical Provider, MD  meloxicam (MOBIC) 7.5 MG tablet Take 7.5 mg by mouth daily. 01/29/17   Historical Provider, MD  methocarbamol (ROBAXIN) 500 MG tablet Take 1 tablet (500 mg total) by mouth every 6 (six) hours as needed for muscle spasms. 02/05/17   Alexzandrew L Perkins, PA-C  omeprazole (PRILOSEC) 40 MG capsule Take 40 mg by mouth 2 (two) times daily.    Historical Provider, MD  oxyCODONE (OXY IR/ROXICODONE) 5 MG immediate release tablet Take 1-2 tablets (5-10 mg total) by mouth every 4 (four) hours as needed for moderate pain or severe pain. 02/05/17   Alexzandrew L Perkins, PA-C  Potassium 99 MG TABS Take 99 mg by mouth every evening.    Historical Provider, MD  rivaroxaban (XARELTO) 10 MG  TABS tablet Take 1 tablet (10 mg total) by mouth daily with breakfast. Take Xarelto for two and a half more weeks following discharge from the hospital, then discontinue Xarelto. Once the patient has completed the blood thinner regimen, then take a Baby 81 mg Aspirin daily for three more weeks. 02/06/17   Alexzandrew L Perkins, PA-C  Tamsulosin HCl (FLOMAX) 0.4 MG CAPS Take 0.4 mg by mouth 2 (two) times daily.    Historical Provider, MD  traMADol (ULTRAM) 50 MG tablet Take 1-2 tablets (50-100 mg total) by mouth every 6 (six) hours as needed for moderate pain. 02/05/17   Alexzandrew Monika Salk, PA-C    Family History Family History  Problem Relation Age of Onset  . Hypertension Father   . Dementia Father   . Diabetes Father   . Heart disease Father   . Hyperlipidemia Father   . Heart attack Father   . Diabetes Brother   . Depression Brother   . Ovarian cancer Sister   . Diabetes Daughter   . Colon cancer Neg Hx   . Stomach cancer Neg Hx   . Esophageal cancer Neg Hx   . Rectal cancer Neg Hx   . Liver cancer Neg Hx     Social History Social History  Substance Use Topics  . Smoking status: Former Smoker    Types: Cigarettes    Quit date: 11/07/1998  . Smokeless tobacco: Never Used  . Alcohol use 3.0 oz/week    5 Shots of liquor per week     Comment: RARE     Allergies   Patient has no known allergies.   Review of Systems Review of Systems  All other systems reviewed and are negative.    Physical Exam Updated Vital Signs BP 144/69 (BP Location: Left Arm)   Pulse 88   Temp 98.6 F (37 C) (Oral)   Resp 18   Ht 5\' 11"  (1.803 m)   Wt 99.8 kg   SpO2 94%   BMI 30.68 kg/m   Physical Exam  Constitutional: He is oriented to person, place, and time. He appears well-developed and well-nourished.  Non-toxic appearance. No distress.  HENT:  Head: Normocephalic and atraumatic.  Eyes: Conjunctivae, EOM and lids are normal. Pupils are equal, round, and reactive to light.    Neck: Normal range of motion. Neck supple. No tracheal deviation present. No thyroid mass present.  Cardiovascular: Normal rate, regular rhythm and normal heart sounds.  Exam reveals no gallop.   No murmur heard. Pulmonary/Chest: Effort normal and breath sounds normal. No stridor. No respiratory distress. He has no decreased breath sounds. He has no wheezes. He has no rhonchi. He has no rales.  Abdominal: Soft. Normal appearance and bowel sounds are normal. He exhibits no distension. There is no tenderness. There is no rebound and no CVA tenderness.  Musculoskeletal: Normal range  of motion. He exhibits no edema or tenderness.  Neurological: He is alert and oriented to person, place, and time. He has normal strength. No cranial nerve deficit or sensory deficit. GCS eye subscore is 4. GCS verbal subscore is 5. GCS motor subscore is 6.  Skin: Skin is warm and dry. No abrasion and no rash noted.  Psychiatric: His affect is blunt. His speech is delayed. He is slowed. He is not actively hallucinating. He is inattentive.  Nursing note and vitals reviewed.    ED Treatments / Results  Labs (all labs ordered are listed, but only abnormal results are displayed) Labs Reviewed  CBG MONITORING, ED - Abnormal; Notable for the following:       Result Value   Glucose-Capillary 139 (*)    All other components within normal limits    EKG  EKG Interpretation None       Radiology No results found.  Procedures Procedures (including critical care time)  Medications Ordered in ED Medications - No data to display   Initial Impression / Assessment and Plan / ED Course  I have reviewed the triage vital signs and the nursing notes.  Pertinent labs & imaging results that were available during my care of the patient were reviewed by me and considered in my medical decision making (see chart for details).     Pt with hyponatremia which is likely causing his delirium Head ct neg Will admit to  medicine  Final Clinical Impressions(s) / ED Diagnoses   Final diagnoses:  None    New Prescriptions New Prescriptions   No medications on file     Lacretia Leigh, MD 02/09/17 2129

## 2017-02-09 NOTE — ED Notes (Signed)
Attempted blood draw-pt did not tolerate well. Will get another staff to help with blood draw.

## 2017-02-09 NOTE — ED Triage Notes (Signed)
Patient BIB son, reports patient had right hip replacement on Friday, since last night family member reports patient has been more confused and "exhibiting obsessive compulsive behavior."  A&Ox3.

## 2017-02-09 NOTE — H&P (Addendum)
History and Physical    [name]. [id number] DOB: Apr 07, 1934 DOA: 02/09/2017  Referring MD/NP/PA:   PCP: Gerrit Heck, MD   Patient coming from:  The patient is coming from home.  At baseline, pt is partially dependent for his ADL.  Chief Complaint: AMS  HPI: [name]. is a 83 y.o. male with medical history significant of hypertension, hyperlipidemia, GERD, BPH, diverticulosis, anemia, ankylosing spondylosis, Barrett's esophagitis, recent right hip replacement (02/04/17, on Xarelto for DVT PPX), who presents with altered mental status.  Per patient's son and daughter, patient underwent right hip replacement on [place]. He is taking Xarelto for DVT PPX. He has been doing well until Friday which he became intermittently confused. Sometimes patient he has combative behavior. Per family, patient moves all extremities, no facial droop, hearing loss or vision change. Per family, pt does not seem to have chest pain, shortness of breath, cough, fever or chills. No flu like symptoms. No lower leg swelling. His son reports that the patient has increased urinary frequency in the past several days. He had nausea and vomited once yesterday, but no nausea, vomiting, abdominal pain or diarrhea today.   ED Course: pt was found to have sodium 117, normal potassium, normal creatinine, WBC 18.5, negative urinalysis, temperature normal, oxygen saturation 95% on room air, negative CT had a full acute intracranial abnormalities.  Review of Systems: Could not be reviewed due to altered mental status.  Allergy: No Known Allergies  Past Medical History:  Diagnosis Date  . Anemia   . Ankylosing spondylitis ([place])   . Arthritis 1974  . Barrett's esophagus 2004  . BPH (benign prostatic hyperplasia)   . Complication of anesthesia    LOWER EXTREMITY SLOW TO AWAKEN   . Diverticulosis   . DJD (degenerative joint disease)    CERVICAL  . GERD (gastroesophageal reflux disease)   . Hiatal  hernia   . Hyperlipidemia   . Hypertension   . Nose abrasion    PRE CANCEROUS AREA LEFT SIDE OF NOSE, ABRASION RIGHT LOWER LEG HEALING  . Sleep disturbance     Past Surgical History:  Procedure Laterality Date  . COLONSCOPY     SEVERAL  . ENSOSCOPY     SEVERAL  . INGUINAL HERNIA REPAIR Bilateral 1997   x2  . JOINT REPLACEMENT Left    total left hip 1984  . LEFT HEART CATH AND CORONARY ANGIOGRAPHY N/A 02/03/2017   Procedure: Left Heart Cath and Coronary Angiography;  Surgeon: Adrian Prows, MD;  Location: [facility];  Service: Cardiovascular;  Laterality: N/A;  . TOE SURGERY Left    left great toe  . [place], 2009, 2010   left x4  . TOTAL HIP ARTHROPLASTY Right 02/04/2017   Procedure: RIGHT TOTAL HIP ARTHROPLASTY ANTERIOR APPROACH;  Surgeon: Gaynelle Arabian, MD;  Location: [facility];  Service: Orthopedics;  Laterality: Right;  requests 143mins  . UMBILICAL HERNIA REPAIR      Social History:  reports that he quit smoking about 18 years ago. His smoking use included Cigarettes. He has never used smokeless tobacco. He reports that he drinks about 3.0 oz of alcohol per week . He reports that he does not use drugs.  Family History:  Family History  Problem Relation Age of Onset  . Hypertension Father   . Dementia Father   . Diabetes Father   . Heart disease Father   . Hyperlipidemia Father   . Heart attack Father   .  Diabetes Brother   . Depression Brother   . Ovarian cancer Sister   . Diabetes Daughter   . Colon cancer Neg Hx   . Stomach cancer Neg Hx   . Esophageal cancer Neg Hx   . Rectal cancer Neg Hx   . Liver cancer Neg Hx      Prior to Admission medications   Medication Sig Start Date End Date Taking? Authorizing Provider  methocarbamol (ROBAXIN) 500 MG tablet Take 1 tablet (500 mg total) by mouth every 6 (six) hours as needed for muscle spasms. 02/05/17  Yes Alexzandrew L Perkins, PA-C  omeprazole (PRILOSEC) 40 MG capsule Take 40 mg by  mouth 2 (two) times daily.   Yes Historical Provider, MD  oxyCODONE (OXY IR/ROXICODONE) 5 MG immediate release tablet Take 1-2 tablets (5-10 mg total) by mouth every 4 (four) hours as needed for moderate pain or severe pain. 02/05/17  Yes Alexzandrew L Perkins, PA-C  rivaroxaban (XARELTO) 10 MG TABS tablet Take 1 tablet (10 mg total) by mouth daily with breakfast. Take Xarelto for two and a half more weeks following discharge from the hospital, then discontinue Xarelto. Once the patient has completed the blood thinner regimen, then take a Baby 81 mg Aspirin daily for three more weeks. 02/06/17  Yes Alexzandrew L Perkins, PA-C  Tamsulosin HCl (FLOMAX) 0.4 MG CAPS Take 0.4 mg by mouth 2 (two) times daily.   Yes Historical Provider, MD  traMADol (ULTRAM) 50 MG tablet Take 1-2 tablets (50-100 mg total) by mouth every 6 (six) hours as needed for moderate pain. 02/05/17  Yes Alexzandrew L Perkins, PA-C  atorvastatin (LIPITOR) 40 MG tablet Take 40 mg by mouth daily at 6 PM.     Historical Provider, MD  hydrochlorothiazide (HYDRODIURIL) 25 MG tablet Take 25 mg by mouth daily. 11/25/16   Historical Provider, MD  losartan-hydrochlorothiazide (HYZAAR) 100-12.5 MG tablet Take 1 tablet by mouth daily. 01/30/17   Historical Provider, MD  meloxicam (MOBIC) 7.5 MG tablet Take 7.5 mg by mouth daily. 01/29/17   Historical Provider, MD  Potassium 99 MG TABS Take 99 mg by mouth every evening.    Historical Provider, MD    Physical Exam: Vitals:   02/09/17 1815 02/09/17 2109 02/09/17 2221 02/09/17 2233  BP: 135/75 124/83 (!) 119/51 112/87  Pulse: 96 87 78 72  Resp: 26 20  16   Temp:  98.1 F (36.7 C) 98.6 F (37 C)   TempSrc:   Axillary   SpO2: 95% 95% 95% 94%  Weight:      Height:       General: Not in acute distress HEENT:       Eyes: PERRL, EOMI, no scleral icterus.       ENT: No discharge from the ears and nose, no pharynx injection, no tonsillar enlargement.        Neck: No JVD, no bruit, no mass felt. Heme:  No neck lymph node enlargement. Cardiac: S1/S2, RRR, No murmurs, No gallops or rubs. Respiratory: No rales, wheezing, rhonchi or rubs. GI: Soft, nondistended, nontender, no rebound pain, no organomegaly, BS present. GU: No hematuria Ext: No pitting leg edema bilaterally. 2+DP/PT pulse bilaterally. S/p of surgery in right hip with well healing surgical site, no draining. Has ecchymosis in the medial side of right upper thigh. Musculoskeletal: No joint deformities, No joint redness or warmth, no limitation of ROM in spin. Skin: No rashes.  Neuro: confused, no oriented X3, cranial nerves II-XII grossly intact, moves all extremities normally. Brachial reflex  2+ bilaterally. Negative Babinski's sign Psych: Could not be reviewed due to altered mental status.  Labs on Admission: I have personally reviewed following labs and imaging studies  CBC:  Recent Labs Lab 02/05/17 0402 02/06/17 0507 02/09/17 1816  WBC 17.5* 13.8* 18.5*  NEUTROABS  --   --  14.9*  HGB 12.4* 11.7* 10.8*  HCT 36.4* 34.3* 29.4*  MCV 94.3 94.5 89.1  PLT 271 229 [id number]   Basic Metabolic Panel:  Recent Labs Lab 02/05/17 0402 02/06/17 0507 02/09/17 1816  NA 134* 135 117*  K 4.2 3.9 4.0  CL 98* 100* 83*  CO2 27 28 25   GLUCOSE 102* 115* 125*  BUN 18 19 15   CREATININE 1.04 0.87 0.72  CALCIUM 8.8* 9.1 8.6*   GFR: Estimated Creatinine Clearance: 87.2 mL/min (by C-G formula based on SCr of 0.72 mg/dL). Liver Function Tests:  Recent Labs Lab 02/09/17 1816  AST 50*  ALT 24  ALKPHOS 57  BILITOT 1.5*  PROT 6.7  ALBUMIN 3.8   No results for input(s): LIPASE, AMYLASE in the last 168 hours. No results for input(s): AMMONIA in the last 168 hours. Coagulation Profile: No results for input(s): INR, PROTIME in the last 168 hours. Cardiac Enzymes: No results for input(s): CKTOTAL, CKMB, CKMBINDEX, TROPONINI in the last 168 hours. BNP (last 3 results) No results for input(s): PROBNP in the last 8760  hours. HbA1C: No results for input(s): HGBA1C in the last 72 hours. CBG:  Recent Labs Lab 02/09/17 1621  GLUCAP 139*   Lipid Profile: No results for input(s): CHOL, HDL, LDLCALC, TRIG, CHOLHDL, LDLDIRECT in the last 72 hours. Thyroid Function Tests: No results for input(s): TSH, T4TOTAL, FREET4, T3FREE, THYROIDAB in the last 72 hours. Anemia Panel: No results for input(s): VITAMINB12, FOLATE, FERRITIN, TIBC, IRON, RETICCTPCT in the last 72 hours. Urine analysis:    Component Value Date/Time   COLORURINE YELLOW 02/09/2017 1718   APPEARANCEUR CLEAR 02/09/2017 1718   LABSPEC 1.014 02/09/2017 1718   PHURINE 7.0 02/09/2017 1718   GLUCOSEU NEGATIVE 02/09/2017 1718   HGBUR NEGATIVE 02/09/2017 1718   BILIRUBINUR NEGATIVE 02/09/2017 1718   KETONESUR NEGATIVE 02/09/2017 1718   PROTEINUR NEGATIVE 02/09/2017 1718   UROBILINOGEN 0.2 11/16/2008 0924   NITRITE NEGATIVE 02/09/2017 1718   LEUKOCYTESUR NEGATIVE 02/09/2017 1718   Sepsis Labs: @LABRCNTIP (procalcitonin:4,lacticidven:4) )No results found for this or any previous visit (from the past 240 hour(s)).   Radiological Exams on Admission: Ct Head Wo Contrast  Result Date: 02/09/2017 CLINICAL DATA:  83 y/o  M; altered mental status. EXAM: CT HEAD WITHOUT CONTRAST TECHNIQUE: Contiguous axial images were obtained from the base of the skull through the vertex without intravenous contrast. COMPARISON:  None. FINDINGS: Brain: No evidence of acute infarction, hemorrhage, hydrocephalus, extra-axial collection or mass lesion/mass effect. Few foci of hypoattenuation in subcortical and periventricular white matter is compatible with mild chronic microvascular ischemic changes. Mild brain parenchymal volume loss. Vascular: No hyperdense vessel or unexpected calcification. Skull: Normal. Negative for fracture or focal lesion. Sinuses/Orbits: No acute finding. Other: None. IMPRESSION: 1. No acute intracranial abnormality. 2. Mild chronic microvascular  ischemic changes and mild parenchymal volume loss of the brain. Electronically Signed   By: Kristine Garbe M.D.   On: 02/09/2017 17:40   Dg Chest Port 1 View  Result Date: 02/09/2017 CLINICAL DATA:  Increased altered mental status EXAM: PORTABLE CHEST 1 VIEW COMPARISON:  11/16/2008 FINDINGS: Mild increased opacity at the left base, cannot exclude atelectasis, infiltrate or possible small effusion. Mild right  basilar atelectasis. Mild cardiomegaly augmented by patient rotation. No overt edema. No pneumothorax. Degenerative changes of the right shoulder and AC joint IMPRESSION: Increased opacity at the left lung base, cannot exclude atelectasis or infiltrate. Possible small left effusion. Mild right basilar atelectasis Mild cardiomegaly Electronically Signed   By: Donavan Foil M.D.   On: 02/09/2017 22:22     EKG: Independently reviewed.  Sinus rhythm, QTC 515, LBBB which is old.    Assessment/Plan Principal Problem:   Acute encephalopathy Active Problems:   Anemia   Essential hypertension   GERD   BPH (benign prostatic hyperplasia)   LBBB (left bundle branch block)   Abnormal EKG   Hyponatremia   Leukocytosis   Acute encephalopathy: Etiology is not completely clear, but likely due to hyponatremia. CT head is negative for acute intracranial abnormalities. Patient does not have signs for stroke. MRI of brain was ordered by [place] physician, but it could not be completed due to chronic limitation of neck movement. Patient has leukocytosis, but no source of infection identified. Urinalysis negative. No respiratory symptoms. Patient's hyponatremia is likely due to decreased oral intake and nausea and vomiting yesterday. His blood pressure medications (Hyzarr and HCTZ) may have also contributed. I discussed with family about choice of invasive IV hypertonic saline (with central line placement) versus noninvasive normal saline IV fluid treatment. The family would like pt be treated with  noninvasive way.  - will admit to tele bed as inpt - Will check urine sodium, urine osmolality, serum osmolality. - check TSH - NPO due to AMS - IVF: 1L NS in ED, will continue with IV normal saline at 100 mL/h - check BMP q6h - hold Hyzarr and HCTZ  Hyponatremia: -see above  Essential hypertension: -hold Hyzarr and HCTZ as above -prn hydralazine IV  GERD: -hold oral PPI -IV pepcid  BPH -hold oral Flumax due to AMS  Hx of LBBB (left bundle branch block): pt has known LBBB. No CP. Pt had cardiac cath on 02/03/17 by Dr. Skip Estimable which did not show significant blockage. -trop x 3  Leukocytosis: Patient has leukocytosis, but no source of infection identified. Urinalysis negative. No respiratory symptoms.   -will check CXR -f/u Ux and Bx. -hold off Abx now. -follow up by CBC  Addendum: pt initially did no have fever, but later on, the rectal temperature is 100. per nurse. Pt may have infection with unclear source. CXR showed increased opacity at the left lung base, cannot exclude infiltrate-->possible aspiration pneumonia?. Possible small left effusion. -will start IV vancomycin and Zosyn empirically -Follow up blood culture the urine culture  Anemia: hgb 11.7 on 02/06/17-->10.8 today. -f/u by CBC.  Recent hx of s/p of right hip replacement: seems to be healing well. -hold oral Robaxin and pain medication -When necessary morphine for pain     DVT ppx: SQ Lovenox Code Status: partial code (OK for CPR, but no intubation) Family Communication: Yes, patient's son and daughter at bed side Disposition Plan:  Anticipate discharge back to previous home environment Consults called:  none Admission status: Inpatient/tele    Date of Service 02/09/2017    Ivor Costa [facility] Pager [phone]  If 7PM-7AM, please contact night-coverage [email or web address] Password [email or web address] 02/09/2017, 11:13 PM

## 2017-02-10 ENCOUNTER — Encounter (HOSPITAL_COMMUNITY): Payer: Self-pay

## 2017-02-10 LAB — RAPID URINE DRUG SCREEN, HOSP PERFORMED
AMPHETAMINES: NOT DETECTED
Barbiturates: NOT DETECTED
Benzodiazepines: NOT DETECTED
Cocaine: NOT DETECTED
Opiates: NOT DETECTED
Tetrahydrocannabinol: NOT DETECTED

## 2017-02-10 LAB — BASIC METABOLIC PANEL
ANION GAP: 6 (ref 5–15)
ANION GAP: 7 (ref 5–15)
ANION GAP: 8 (ref 5–15)
ANION GAP: 9 (ref 5–15)
BUN: 15 mg/dL (ref 6–20)
BUN: 16 mg/dL (ref 6–20)
BUN: 16 mg/dL (ref 6–20)
BUN: 19 mg/dL (ref 6–20)
CALCIUM: 8.3 mg/dL — AB (ref 8.9–10.3)
CALCIUM: 8.7 mg/dL — AB (ref 8.9–10.3)
CHLORIDE: 92 mmol/L — AB (ref 101–111)
CHLORIDE: 92 mmol/L — AB (ref 101–111)
CO2: 24 mmol/L (ref 22–32)
CO2: 26 mmol/L (ref 22–32)
CO2: 27 mmol/L (ref 22–32)
CO2: 27 mmol/L (ref 22–32)
Calcium: 8.9 mg/dL (ref 8.9–10.3)
Calcium: 8.4 mg/dL — ABNORMAL LOW (ref 8.9–10.3)
Chloride: 85 mmol/L — ABNORMAL LOW (ref 101–111)
Chloride: 89 mmol/L — ABNORMAL LOW (ref 101–111)
Creatinine, Ser: 0.76 mg/dL (ref 0.61–1.24)
Creatinine, Ser: 0.77 mg/dL (ref 0.61–1.24)
Creatinine, Ser: 0.79 mg/dL (ref 0.61–1.24)
Creatinine, Ser: 0.79 mg/dL (ref 0.61–1.24)
GFR calc Af Amer: >60 mL/min (ref >60)
GFR calc Af Amer: >60 mL/min (ref >60)
GFR calc Af Amer: >60 mL/min (ref >60)
GFR calc non Af Amer: >60 mL/min (ref >60)
GLUCOSE: 121 mg/dL — AB (ref 65–99)
Glucose, Bld: 104 mg/dL — ABNORMAL HIGH (ref 65–99)
Glucose, Bld: 104 mg/dL — ABNORMAL HIGH (ref 65–99)
Glucose, Bld: 107 mg/dL — ABNORMAL HIGH (ref 65–99)
POTASSIUM: 3.4 mmol/L — AB (ref 3.5–5.1)
POTASSIUM: 3.6 mmol/L (ref 3.5–5.1)
POTASSIUM: 3.8 mmol/L (ref 3.5–5.1)
POTASSIUM: 4.5 mmol/L (ref 3.5–5.1)
SODIUM: 119 mmol/L — AB (ref 135–145)
SODIUM: 122 mmol/L — AB (ref 135–145)
SODIUM: 125 mmol/L — AB (ref 135–145)
SODIUM: 126 mmol/L — AB (ref 135–145)

## 2017-02-10 LAB — TROPONIN I
TROPONIN I: 0.03 ng/mL — AB (ref <0.03)
TROPONIN I: 0.03 ng/mL — AB (ref <0.03)
Troponin I: 0.03 ng/mL (ref <0.03)

## 2017-02-10 LAB — SODIUM, URINE, RANDOM: Sodium, Ur: 102 mmol/L

## 2017-02-10 LAB — LACTIC ACID, PLASMA: LACTIC ACID, VENOUS: 1.1 mmol/L (ref 0.5–1.9)

## 2017-02-10 LAB — TSH: TSH: 1.297 u[IU]/mL (ref 0.350–4.500)

## 2017-02-10 LAB — OSMOLALITY, URINE: Osmolality, Ur: 539 mOsm/kg (ref 300–900)

## 2017-02-10 LAB — CBG MONITORING, ED: GLUCOSE-CAPILLARY: 110 mg/dL — AB (ref 65–99)

## 2017-02-10 LAB — LIPASE, BLOOD: LIPASE: 20 U/L (ref 11–51)

## 2017-02-10 MED ORDER — VANCOMYCIN HCL IN DEXTROSE 750-5 MG/150ML-% IV SOLN
750.0000 mg | Freq: Two times a day (BID) | INTRAVENOUS | Status: DC
  Administered 2017-02-10 – 2017-02-12 (×5): 750 mg via INTRAVENOUS
  Filled 2017-02-10 (×6): qty 150

## 2017-02-10 MED ORDER — POTASSIUM CHLORIDE CRYS ER 20 MEQ PO TBCR
40.0000 meq | EXTENDED_RELEASE_TABLET | Freq: Once | ORAL | Status: AC
  Administered 2017-02-10: 40 meq via ORAL
  Filled 2017-02-10: qty 2

## 2017-02-10 MED ORDER — PIPERACILLIN SOD-TAZOBACTAM SO 2.25 (2-0.25) G IV SOLR
3.3750 g | Freq: Three times a day (TID) | INTRAVENOUS | Status: DC
  Administered 2017-02-10 – 2017-02-12 (×5): 3.375 g via INTRAVENOUS
  Filled 2017-02-10 (×7): qty 3.38

## 2017-02-10 MED ORDER — PIPERACILLIN-TAZOBACTAM 3.375 G IVPB
3.3750 g | INTRAVENOUS | Status: AC
  Administered 2017-02-10: 3.375 g via INTRAVENOUS
  Filled 2017-02-10: qty 50

## 2017-02-10 MED ORDER — PIPERACILLIN SOD-TAZOBACTAM SO 2.25 (2-0.25) G IV SOLR
3.3750 g | Freq: Three times a day (TID) | INTRAVENOUS | Status: DC
  Filled 2017-02-10: qty 3.38

## 2017-02-10 NOTE — ED Notes (Signed)
Lattie Haw Secretary made aware to order patient heart healthy lunch now that have new diet orders.   Made family at bedside aware that have new diet orders and ordered a lunch tray.

## 2017-02-10 NOTE — ED Notes (Signed)
Pt placed in hospital bed when moved from RES B to RM 15.  Pt is more comfortable and less agitated.

## 2017-02-10 NOTE — ED Notes (Signed)
Pt pulled out IV and continues to show agitation, difficult to get vitals on.  MD aware.

## 2017-02-10 NOTE — ED Notes (Signed)
Second set of blood cultures obtained at 0215 from right forearm.

## 2017-02-10 NOTE — ED Notes (Signed)
Patient placed on bed alarm.

## 2017-02-10 NOTE — ED Notes (Signed)
Gave report to Carrie RN.

## 2017-02-10 NOTE — Progress Notes (Signed)
Pharmacy Antibiotic Note  [name]. is a 83 y.o. male c/o mental status changes admitted on 02/09/2017 with sepsis.  Pharmacy has been consulted for vancomycin dosing.  Plan: Zosyn 3.375g IV q8h (4 hour infusion). (MD) Vancomycin 750 mg IV q12h VT=15-20 mg/L Daily SCr  Height: 5\' 11"  (180.3 cm) Weight: 220 lb (99.8 kg) IBW/kg (Calculated) : 75.3  Temp (24hrs), Avg:98.8 F (37.1 C), Min:98.1 F (36.7 C), Max:100 F (37.8 C)   Recent Labs Lab 02/05/17 0402 02/06/17 0507 02/09/17 1816 02/09/17 2330  WBC 17.5* 13.8* 18.5*  --   CREATININE 1.04 0.87 0.72 0.79    Estimated Creatinine Clearance: 87.2 mL/min (by C-G formula based on SCr of 0.79 mg/dL).    No Known Allergies  Antimicrobials this admission: 3/6 zosyn >>  3/6 vancomycin >>   Dose adjustments this admission:   Microbiology results:  BCx:   UCx:    Sputum:    MRSA PCR:   Thank you for allowing pharmacy to be a part of this patient's care.  Dorrene German 02/10/2017 2:11 AM

## 2017-02-10 NOTE — Progress Notes (Signed)
PROGRESS NOTE    [name].  [id number] DOB: [birth year] DOA: 02/09/2017 PCP: Gerrit Heck, MD     Brief Narrative:  [name]. is a 83 y.o. male with medical history significant of hypertension, hyperlipidemia, GERD, BPH, diverticulosis, anemia, ankylosing spondylosis, Barrett's esophagitis, recent right hip replacement (02/04/17, on Xarelto for DVT PPX), who presents with altered mental status. Per patient's son and daughter, patient underwent right hip replacement on Wednesday. He has been doing well until Friday which he became intermittently confused. Work up revealed hyponatremia of 117. CT head unremarkable.   Assessment & Plan:   Principal Problem:   Acute encephalopathy Active Problems:   Anemia   Essential hypertension   GERD   BPH (benign prostatic hyperplasia)   LBBB (left bundle branch block)   Abnormal EKG   Hyponatremia   Leukocytosis  Acute metabolic encephalopathy -CT head is unremarkable. MRI brain was ordered by ED physician that unable to be completed due to chronic limitation of movement -Likely secondary to acute hyponatremia -Continue to monitor   Acute hyponatremia -Likely in setting of poor oral intake, BP Meds  -Hold Hyzaar, HCTZ -Improving with IVF, will decrease IVF rate today as Na improving  -Trend BMP   HCAP vs aspiration pneumonia -Empiric vanco/zosyn -Blood cultures pending -SLP   Essential HTN -Hold Hyzaar, HCTZ -PRN hydralazine  GERD -PPI  Recent hx of s/p of right hip replacement -PT OT    DVT prophylaxis: lovenox Code Status: partial code Family Communication: no family at bedside Disposition Plan: pending further improvement, stabilization   Consultants:   None  Procedures:   None  Antimicrobials:   Vanco  Zosyn    Subjective: Patient very confused on my exam, unable to participate in exam, not oriented nor answers questions appropriately   Objective: Vitals:   02/10/17 0546  02/10/17 0641 02/10/17 0800 02/10/17 0912  BP: 137/67 118/89 157/79 164/73  Pulse: 79 79 84 81  Resp: 20 24 19 19   Temp:      TempSrc:      SpO2: 100% 97% 97% 100%  Weight:      Height:        Intake/Output Summary (Last 24 hours) at 02/10/17 1423 Last data filed at 02/10/17 0938  Gross per 24 hour  Intake               53 ml  Output                0 ml  Net               53 ml   Filed Weights   02/09/17 1610  Weight: 99.8 kg (220 lb)    Examination:  General exam: Appears calm and comfortable, No acute distress Respiratory system: Clear to auscultation. Respiratory effort normal. Cardiovascular system: S1 & S2 heard, RRR. No JVD, murmurs, rubs, gallops or clicks. No pedal edema. Gastrointestinal system: Abdomen is nondistended, soft and nontender. No organomegaly or masses felt. Normal bowel sounds heard. Central nervous system: Alert but confused, not oriented to place or time, unable to participate in exam or follow directions or answer questions appropriately Extremities: Symmetric 5 x 5 power. Skin: No rashes, lesions or ulcers  Data Reviewed: I have personally reviewed following labs and imaging studies  CBC:  Recent Labs Lab 02/05/17 0402 02/06/17 0507 02/09/17 1816  WBC 17.5* 13.8* 18.5*  NEUTROABS  --   --  14.9*  HGB 12.4* 11.7* 10.8*  HCT 36.4*  34.3* 29.4*  MCV 94.3 94.5 89.1  PLT 271 229 [id number]   Basic Metabolic Panel:  Recent Labs Lab 02/06/17 0507 02/09/17 1816 02/09/17 2330 02/10/17 0600 02/10/17 1152  NA 135 117* 119* 122* 126*  K 3.9 4.0 3.8 3.4* 4.5  CL 100* 83* 85* 89* 92*  CO2 28 25 26 24 27   GLUCOSE 115* 125* 107* 121* 104*  BUN 19 15 16 15 16   CREATININE 0.87 0.72 0.79 0.79 0.76  CALCIUM 9.1 8.6* 8.7* 8.3* 8.9   GFR: Estimated Creatinine Clearance: 87.2 mL/min (by C-G formula based on SCr of 0.76 mg/dL). Liver Function Tests:  Recent Labs Lab 02/09/17 1816  AST 50*  ALT 24  ALKPHOS 57  BILITOT 1.5*  PROT 6.7  ALBUMIN 3.8     Recent Labs Lab 02/09/17 2330  LIPASE 20   No results for input(s): AMMONIA in the last 168 hours. Coagulation Profile: No results for input(s): INR, PROTIME in the last 168 hours. Cardiac Enzymes:  Recent Labs Lab 02/09/17 2330 02/10/17 0600 02/10/17 1025  TROPONINI 0.03* 0.03* 0.03*   BNP (last 3 results) No results for input(s): PROBNP in the last 8760 hours. HbA1C: No results for input(s): HGBA1C in the last 72 hours. CBG:  Recent Labs Lab 02/09/17 1621 02/10/17 0840  GLUCAP 139* 110*   Lipid Profile: No results for input(s): CHOL, HDL, LDLCALC, TRIG, CHOLHDL, LDLDIRECT in the last 72 hours. Thyroid Function Tests:  Recent Labs  02/10/17 0600  TSH 1.297   Anemia Panel: No results for input(s): VITAMINB12, FOLATE, FERRITIN, TIBC, IRON, RETICCTPCT in the last 72 hours. Sepsis Labs:  Recent Labs Lab 02/10/17 0215  LATICACIDVEN 1.1    No results found for this or any previous visit (from the past 240 hour(s)).     Radiology Studies: Ct Head Wo Contrast  Result Date: 02/09/2017 CLINICAL DATA:  83 y/o  M; altered mental status. EXAM: CT HEAD WITHOUT CONTRAST TECHNIQUE: Contiguous axial images were obtained from the base of the skull through the vertex without intravenous contrast. COMPARISON:  None. FINDINGS: Brain: No evidence of acute infarction, hemorrhage, hydrocephalus, extra-axial collection or mass lesion/mass effect. Few foci of hypoattenuation in subcortical and periventricular white matter is compatible with mild chronic microvascular ischemic changes. Mild brain parenchymal volume loss. Vascular: No hyperdense vessel or unexpected calcification. Skull: Normal. Negative for fracture or focal lesion. Sinuses/Orbits: No acute finding. Other: None. IMPRESSION: 1. No acute intracranial abnormality. 2. Mild chronic microvascular ischemic changes and mild parenchymal volume loss of the brain. Electronically Signed   By: Kristine Garbe M.D.   On:  02/09/2017 17:40   Dg Chest Port 1 View  Result Date: 02/09/2017 CLINICAL DATA:  Increased altered mental status EXAM: PORTABLE CHEST 1 VIEW COMPARISON:  11/16/2008 FINDINGS: Mild increased opacity at the left base, cannot exclude atelectasis, infiltrate or possible small effusion. Mild right basilar atelectasis. Mild cardiomegaly augmented by patient rotation. No overt edema. No pneumothorax. Degenerative changes of the right shoulder and AC joint IMPRESSION: Increased opacity at the left lung base, cannot exclude atelectasis or infiltrate. Possible small left effusion. Mild right basilar atelectasis Mild cardiomegaly Electronically Signed   By: Donavan Foil M.D.   On: 02/09/2017 22:22      Scheduled Meds: . enoxaparin (LOVENOX) injection  40 mg Subcutaneous Q24H  . sodium chloride flush  3 mL Intravenous Q12H  . vancomycin  750 mg Intravenous Q12H   Continuous Infusions: . sodium chloride 100 mL/hr at 02/10/17 0218  . famotidine (PEPCID)  IV Stopped (02/10/17 [id number])  . piperacillin-tazobactam (ZOSYN)  IV       LOS: 1 day    Time spent: 40 minutes   Dessa Phi, DO [facility] [email or web address] Password [email or web address] 02/10/2017, 2:23 PM

## 2017-02-10 NOTE — ED Notes (Signed)
Pt's agitated behavior has decreased and he is sleeping more.  Will continue fluids and abx per hospitalist.  Will continue to monitor VS and behavior for agitation as needed to determine if restraints are required.  For now pt is sleeping and unrestrained.  MD aware.

## 2017-02-10 NOTE — ED Notes (Signed)
Contact numbers: Donnald Garre (daughter) [phone] Milta Deiters (son) [phone] Bev (daughter in law) [phone]

## 2017-02-10 NOTE — ED Notes (Signed)
First set of blood cultures obtained from left forearm at [facility]

## 2017-02-11 LAB — CBC WITH DIFFERENTIAL/PLATELET
BASOS PCT: 0 %
Basophils Absolute: 0 10*3/uL (ref 0.0–0.1)
EOS ABS: 0.8 10*3/uL — AB (ref 0.0–0.7)
EOS PCT: 6 %
HCT: 29.1 % — ABNORMAL LOW (ref 39.0–52.0)
Hemoglobin: 10.3 g/dL — ABNORMAL LOW (ref 13.0–17.0)
LYMPHS ABS: 1.3 10*3/uL (ref 0.7–4.0)
Lymphocytes Relative: 10 %
MCH: 32.6 pg (ref 26.0–34.0)
MCHC: 35.4 g/dL (ref 30.0–36.0)
MCV: 92.1 fL (ref 78.0–100.0)
MONOS PCT: 13 %
Monocytes Absolute: 1.7 10*3/uL — ABNORMAL HIGH (ref 0.1–1.0)
Neutro Abs: 9.1 10*3/uL — ABNORMAL HIGH (ref 1.7–7.7)
Neutrophils Relative %: 71 %
Platelets: 306 10*3/uL (ref 150–400)
RBC: 3.16 MIL/uL — ABNORMAL LOW (ref 4.22–5.81)
RDW: 12 % (ref 11.5–15.5)
WBC: 12.9 10*3/uL — AB (ref 4.0–10.5)

## 2017-02-11 LAB — GLUCOSE, CAPILLARY: GLUCOSE-CAPILLARY: 113 mg/dL — AB (ref 65–99)

## 2017-02-11 LAB — URINE CULTURE: Culture: NO GROWTH

## 2017-02-11 LAB — CREATININE, SERUM
CREATININE: 0.8 mg/dL (ref 0.61–1.24)
GFR calc Af Amer: >60 mL/min (ref >60)

## 2017-02-11 NOTE — Progress Notes (Signed)
Report received from M. O'Bryant, RN. No change from initial pm assessment. Will continue to monitor and follow the POC.

## 2017-02-11 NOTE — Evaluation (Signed)
Physical Therapy Evaluation Patient Details Name: [name]. MRN: [id number] DOB: Aug 14, 1934 Today's Date: 02/11/2017   History of Present Illness  Pt is an 83 year old male adm  with encephalopathy, recent R direct anterior THA; PMhx of L THA with multiple left hip surgeries, recent cardiac cath with R radial artery   Clinical Impression  Pt admitted with above diagnosis. Pt currently with functional limitations due to the deficits listed below (see PT Problem List).  Pt will benefit from skilled PT to increase their independence and safety with mobility to allow discharge to the venue listed below.   Pt will benefit from HHPT at D/C; HR 74-91 during PT/amb; pt oriented x3 at this time; will see next day or as schedule permits     Follow Up Recommendations Home health PT;Supervision/Assistance - 24 hour (initial 24hr supervision)    Equipment Recommendations  None recommended by PT    Recommendations for Other Services       Precautions / Restrictions Precautions Precautions: Fall Restrictions RUE Weight Bearing: Weight bearing as tolerated      Mobility  Bed Mobility Overal bed mobility: Needs Assistance Bed Mobility: Supine to Sit;Sit to Supine     Supine to sit: Min guard Sit to supine: Min assist   General bed mobility comments: assist wtih RLE onto bed  Transfers Overall transfer level: Needs assistance Equipment used: Rolling walker (2 wheeled) Transfers: Sit to/from Stand Sit to Stand: Min guard         General transfer comment: cues for hand placement, assist to rise and stabilize  Ambulation/Gait Ambulation/Gait assistance: Min guard;Min assist Ambulation Distance (Feet): 100 Feet Assistive device: Rolling walker (2 wheeled) Gait Pattern/deviations: Step-to pattern;Step-through pattern     General Gait Details: cues for posture, RW position, step length  Stairs            Wheelchair Mobility    Modified Rankin (Stroke Patients Only)        Balance                                             Pertinent Vitals/Pain Pain Assessment: 0-10 Pain Score: 4  Pain Location: R hip Pain Descriptors / Indicators: Sore;Tightness Pain Intervention(s): Limited activity within patient's tolerance;Monitored during session;Premedicated before session;Ice applied    Home Living Family/patient expects to be discharged to:: Private residence Living Arrangements: Spouse/significant other Available Help at Discharge: Family Type of Home: House Home Access: Stairs to enter   Technical brewer of Steps: 3 Home Layout: Able to live on main level with bedroom/bathroom Home Equipment: Bedside commode;Shower seat      Prior Function Level of Independence: Independent with assistive device(s)               Hand Dominance        Extremity/Trunk Assessment   Upper Extremity Assessment Upper Extremity Assessment: Defer to OT evaluation;Overall WFL for tasks assessed    Lower Extremity Assessment RLE Deficits / Details: right hip 2+ to 3/5 AAROm WFL, limited by post op soreness; knee 2+/5, ankle WFL       Communication   Communication: HOH  Cognition Arousal/Alertness: Awake/alert Behavior During Therapy: WFL for tasks assessed/performed Overall Cognitive Status: Within Functional Limits for tasks assessed  General Comments      Exercises Total Joint Exercises Ankle Circles/Pumps: AROM;Both;10 reps Quad Sets: AROM;Right;10 reps Heel Slides: AAROM;Right;10 reps Long Arc Quad: AROM;Right;10 reps   Assessment/Plan    PT Assessment Patient needs continued PT services  PT Problem List Decreased strength;Decreased mobility;Decreased knowledge of use of DME;Pain;Decreased activity tolerance       PT Treatment Interventions Functional mobility training;Stair training;Gait training;DME instruction;Therapeutic activities;Therapeutic exercise;Patient/family education     PT Goals (Current goals can be found in the Care Plan section)  Acute Rehab PT Goals Patient Stated Goal: home soon PT Goal Formulation: With patient/family Time For Goal Achievement: 02/18/17 Potential to Achieve Goals: Good    Frequency Min 4X/week   Barriers to discharge        Co-evaluation               End of Session Equipment Utilized During Treatment: Gait belt Activity Tolerance: Patient tolerated treatment well Patient left: with call bell/phone within reach;with family/visitor present;in bed Nurse Communication: Mobility status PT Visit Diagnosis: Other abnormalities of gait and mobility (R26.89);Pain Pain - Right/Left: Right Pain - part of body: Hip         Time: 2993-7169 PT Time Calculation (min) (ACUTE ONLY): 25 min   Charges:   PT Evaluation $PT Eval Low Complexity: 1 Procedure PT Treatments $Gait Training: 8-22 mins   PT G Codes:         [name] 02-12-17, 4:16 PM

## 2017-02-11 NOTE — Progress Notes (Signed)
PROGRESS NOTE    [name].  WUJ:[id number] DOB: January 02, 1934 DOA: 02/09/2017 PCP: Jordan Heck, MD     Brief Narrative:  [name]. is a 83 y.o. male with medical history significant of hypertension, hyperlipidemia, GERD, BPH, diverticulosis, anemia, ankylosing spondylosis, Barrett's esophagitis, recent right hip replacement (02/04/17, on Xarelto for DVT PPX), who presents with altered mental status. Per patient's son and daughter, patient underwent right hip replacement on Wednesday. He has been doing well until Friday which he became intermittently confused. Work up revealed hyponatremia of 117. CT head unremarkable.   Assessment & Plan:   Principal Problem:   Acute encephalopathy Active Problems:   Anemia   Essential hypertension   GERD   BPH (benign prostatic hyperplasia)   LBBB (left bundle branch block)   Abnormal EKG   Hyponatremia   Leukocytosis  Acute metabolic encephalopathy -CT head is unremarkable. MRI brain was ordered by ED physician that unable to be completed due to chronic limitation of movement -Likely secondary to acute hyponatremia -Back to baseline per family   Acute hyponatremia -Likely in setting of poor oral intake, BP Meds  -Hold Hyzaar, HCTZ -Improving with IVF -Trend BMP   HCAP vs aspiration pneumonia -Empiric vanco/zosyn -Blood cultures pending -SLP to evaluate  Essential HTN -Hold Hyzaar, HCTZ -PRN hydralazine  GERD -PPI  Recent hx of s/p of right hip replacement -PT OT    DVT prophylaxis: lovenox Code Status: partial code Family Communication: daughter at bedside Disposition Plan: pending further improvement, stabilization   Consultants:   None  Procedures:   None  Antimicrobials:   Vanco  Zosyn    Subjective: Patient's mentation is nearly back to baseline. He is oriented to self and place and current president this morning, but not to year. He has no complaints on examination. He states that he  has some soreness in the right hip   Objective: Vitals:   02/10/17 1448 02/10/17 1500 02/10/17 2033 02/11/17 0407  BP: 107/76 130/65 (!) 136/56 (!) 137/58  Pulse: 79  83 77  Resp: 19 19 20 18   Temp:  97.8 F (36.6 C) 98.5 F (36.9 C) 97.5 F (36.4 C)  TempSrc:  Oral Oral Oral  SpO2: 99% 95% 95% 95%  Weight:  100.1 kg (220 lb 10.9 oz)    Height:  5\' 11"  (1.803 m)      Intake/Output Summary (Last 24 hours) at 02/11/17 1147 Last data filed at 02/11/17 0900  Gross per 24 hour  Intake             1330 ml  Output              875 ml  Net              455 ml   Filed Weights   02/09/17 1610 02/10/17 1500  Weight: 99.8 kg (220 lb) 100.1 kg (220 lb 10.9 oz)    Examination:  General exam: Appears calm and comfortable, No acute distress Respiratory system: Clear to auscultation. Respiratory effort normal. Cardiovascular system: S1 & S2 heard, RRR. No JVD, murmurs, rubs, gallops or clicks. No pedal edema. Gastrointestinal system: Abdomen is nondistended, soft and nontender. No organomegaly or masses felt. Normal bowel sounds heard. Central nervous system: Alert oriented to self, place, president, but not to year. Able to answer questions appropriately and follow directions  Extremities: Symmetric 5 x 5 power. Skin: No rashes, lesions or ulcers  Data Reviewed: I have personally reviewed following labs and  imaging studies  CBC:  Recent Labs Lab 02/05/17 0402 02/06/17 0507 02/09/17 1816 02/11/17 0523  WBC 17.5* 13.8* 18.5* 12.9*  NEUTROABS  --   --  14.9* 9.1*  HGB 12.4* 11.7* 10.8* 10.3*  HCT 36.4* 34.3* 29.4* 29.1*  MCV 94.3 94.5 89.1 92.1  PLT 271 229 302 244   Basic Metabolic Panel:  Recent Labs Lab 02/09/17 1816 02/09/17 2330 02/10/17 0600 02/10/17 1152 02/10/17 1903 02/11/17 0523  NA 117* 119* 122* 126* 125*  --   K 4.0 3.8 3.4* 4.5 3.6  --   CL 83* 85* 89* 92* 92*  --   CO2 25 26 24 27 27   --   GLUCOSE 125* 107* 121* 104* 104*  --   BUN 15 16 15 16 19    --   CREATININE 0.72 0.79 0.79 0.76 0.77 0.80  CALCIUM 8.6* 8.7* 8.3* 8.9 8.4*  --    GFR: Estimated Creatinine Clearance: 87.3 mL/min (by C-G formula based on SCr of 0.8 mg/dL). Liver Function Tests:  Recent Labs Lab 02/09/17 1816  AST 50*  ALT 24  ALKPHOS 57  BILITOT 1.5*  PROT 6.7  ALBUMIN 3.8    Recent Labs Lab 02/09/17 2330  LIPASE 20   No results for input(s): AMMONIA in the last 168 hours. Coagulation Profile: No results for input(s): INR, PROTIME in the last 168 hours. Cardiac Enzymes:  Recent Labs Lab 02/09/17 2330 02/10/17 0600 02/10/17 1025  TROPONINI 0.03* 0.03* 0.03*   BNP (last 3 results) No results for input(s): PROBNP in the last 8760 hours. HbA1C: No results for input(s): HGBA1C in the last 72 hours. CBG:  Recent Labs Lab 02/09/17 1621 02/10/17 0840 02/11/17 0739  GLUCAP 139* 110* 113*   Lipid Profile: No results for input(s): CHOL, HDL, LDLCALC, TRIG, CHOLHDL, LDLDIRECT in the last 72 hours. Thyroid Function Tests:  Recent Labs  02/10/17 0600  TSH 1.297   Anemia Panel: No results for input(s): VITAMINB12, FOLATE, FERRITIN, TIBC, IRON, RETICCTPCT in the last 72 hours. Sepsis Labs:  Recent Labs Lab 02/10/17 0215  LATICACIDVEN 1.1    Recent Results (from the past 240 hour(s))  Urine culture     Status: None   Collection Time: 02/09/17  5:18 PM  Result Value Ref Range Status   Specimen Description URINE, RANDOM  Final   Special Requests NONE  Final   Culture   Final    NO GROWTH Performed at [facility], [address], [place], [place] [address]    Report Status 02/11/2017 FINAL  Final       Radiology Studies: Ct Head Wo Contrast  Result Date: 02/09/2017 CLINICAL DATA:  83 y/o  M; altered mental status. EXAM: CT HEAD WITHOUT CONTRAST TECHNIQUE: Contiguous axial images were obtained from the base of the skull through the vertex without intravenous contrast. COMPARISON:  None. FINDINGS: Brain: No evidence of  acute infarction, hemorrhage, hydrocephalus, extra-axial collection or mass lesion/mass effect. Few foci of hypoattenuation in subcortical and periventricular white matter is compatible with mild chronic microvascular ischemic changes. Mild brain parenchymal volume loss. Vascular: No hyperdense vessel or unexpected calcification. Skull: Normal. Negative for fracture or focal lesion. Sinuses/Orbits: No acute finding. Other: None. IMPRESSION: 1. No acute intracranial abnormality. 2. Mild chronic microvascular ischemic changes and mild parenchymal volume loss of the brain. Electronically Signed   By: Kristine Garbe M.D.   On: 02/09/2017 17:40   Dg Chest Port 1 View  Result Date: 02/09/2017 CLINICAL DATA:  Increased altered  mental status EXAM: PORTABLE CHEST 1 VIEW COMPARISON:  11/16/2008 FINDINGS: Mild increased opacity at the left base, cannot exclude atelectasis, infiltrate or possible small effusion. Mild right basilar atelectasis. Mild cardiomegaly augmented by patient rotation. No overt edema. No pneumothorax. Degenerative changes of the right shoulder and AC joint IMPRESSION: Increased opacity at the left lung base, cannot exclude atelectasis or infiltrate. Possible small left effusion. Mild right basilar atelectasis Mild cardiomegaly Electronically Signed   By: Donavan Foil M.D.   On: 02/09/2017 22:22      Scheduled Meds: . enoxaparin (LOVENOX) injection  40 mg Subcutaneous Q24H  . famotidine (PEPCID) IV  20 mg Intravenous Q12H  . piperacillin-tazobactam (ZOSYN)  IV  3.375 g Intravenous Q8H  . sodium chloride flush  3 mL Intravenous Q12H  . vancomycin  750 mg Intravenous Q12H   Continuous Infusions: . sodium chloride 50 mL/hr at 02/10/17 1721     LOS: 2 days    Time spent: 30 minutes   Dessa Phi, DO [facility] [email or web address] Password [facility] 02/11/2017, 11:47 AM

## 2017-02-11 NOTE — Evaluation (Signed)
SLP Cancellation Note  Patient Details Name: [name]. MRN: [id number] DOB: 04-Dec-1934   Cancelled treatment:       Reason Eval/Treat Not Completed: Other (comment) (pt working with PT 1545, will continue efforts)   Luanna Salk, [place] SLP [phone]

## 2017-02-12 ENCOUNTER — Inpatient Hospital Stay (HOSPITAL_COMMUNITY): Payer: Medicare HMO

## 2017-02-12 DIAGNOSIS — G9340 Encephalopathy, unspecified: Secondary | ICD-10-CM

## 2017-02-12 DIAGNOSIS — D72829 Elevated white blood cell count, unspecified: Secondary | ICD-10-CM

## 2017-02-12 DIAGNOSIS — E871 Hypo-osmolality and hyponatremia: Principal | ICD-10-CM

## 2017-02-12 DIAGNOSIS — I10 Essential (primary) hypertension: Secondary | ICD-10-CM

## 2017-02-12 LAB — GLUCOSE, CAPILLARY: Glucose-Capillary: 112 mg/dL — ABNORMAL HIGH (ref 65–99)

## 2017-02-12 LAB — CBC
HCT: 29.4 % — ABNORMAL LOW (ref 39.0–52.0)
HEMOGLOBIN: 10.3 g/dL — AB (ref 13.0–17.0)
MCH: 32.7 pg (ref 26.0–34.0)
MCHC: 35 g/dL (ref 30.0–36.0)
MCV: 93.3 fL (ref 78.0–100.0)
Platelets: 365 10*3/uL (ref 150–400)
RBC: 3.15 MIL/uL — ABNORMAL LOW (ref 4.22–5.81)
RDW: 12.4 % (ref 11.5–15.5)
WBC: 14.9 10*3/uL — ABNORMAL HIGH (ref 4.0–10.5)

## 2017-02-12 LAB — MRSA PCR SCREENING: MRSA BY PCR: NEGATIVE

## 2017-02-12 LAB — BASIC METABOLIC PANEL
Anion gap: 5 (ref 5–15)
BUN: 12 mg/dL (ref 6–20)
CHLORIDE: 101 mmol/L (ref 101–111)
CO2: 28 mmol/L (ref 22–32)
CREATININE: 0.79 mg/dL (ref 0.61–1.24)
Calcium: 8.3 mg/dL — ABNORMAL LOW (ref 8.9–10.3)
GFR calc non Af Amer: >60 mL/min (ref >60)
Glucose, Bld: 116 mg/dL — ABNORMAL HIGH (ref 65–99)
Potassium: 3.7 mmol/L (ref 3.5–5.1)
Sodium: 134 mmol/L — ABNORMAL LOW (ref 135–145)

## 2017-02-12 MED ORDER — PIPERACILLIN-TAZOBACTAM 3.375 G IVPB
3.3750 g | Freq: Three times a day (TID) | INTRAVENOUS | Status: DC
  Administered 2017-02-12: 3.375 g via INTRAVENOUS
  Filled 2017-02-12 (×2): qty 50

## 2017-02-12 MED ORDER — AMOXICILLIN-POT CLAVULANATE 875-125 MG PO TABS
1.0000 | ORAL_TABLET | Freq: Two times a day (BID) | ORAL | Status: DC
  Administered 2017-02-12 – 2017-02-13 (×2): 1 via ORAL
  Filled 2017-02-12 (×2): qty 1

## 2017-02-12 NOTE — Progress Notes (Signed)
Discharge planning, spoke with patient and son at bedside. Was using [facility] PT, patient states only had a few visits. Contacted [facility] to update them on patients situation. Plan for d/c home tomorrow with resumption of services by [facility]. [phone]

## 2017-02-12 NOTE — Evaluation (Signed)
SLP Cancellation Note  Patient Details Name: [name]. MRN: [id number] DOB: August 28, 1934   Cancelled treatment:       Reason Eval/Treat Not Completed: Other (comment) (pt working with English as a second language teacher, will continue efforts)   Claudie Fisherman, [place] [facility] SLP [phone]

## 2017-02-12 NOTE — Evaluation (Signed)
SLP Cancellation Note  Patient Details Name: [name]. MRN: [id number] DOB: 1934/10/10   Cancelled treatment:       Reason Eval/Treat Not Completed: Patient declined, no reason specified (pt and son present deny pt with dysphagia and politely decline evaluation.  thanks.)   Luanna Salk, [place] [facility] SLP [phone]  Macario Golds 02/12/2017, 9:10 PM

## 2017-02-12 NOTE — Progress Notes (Signed)
PROGRESS NOTE [facility]   [name].   FMB:[id number] DOB: 07-20-1934  DOA: 02/09/2017 PCP: Gerrit Heck, MD   Brief Narrative:  Jordan Fuentesis a 83 y.o.malewith medical history significant of hypertension, hyperlipidemia, GERD, BPH, diverticulosis, anemia, ankylosing spondylosis, Barrett's esophagitis, recent right hip replacement (02/04/17, on Xarelto for DVT PPX), who presents with altered mental status. Per patient's son and daughter, patient underwent right hip replacement on Wednesday. He has been doing well until Friday which he became intermittently confused. Work up revealed hyponatremia of 117. CT head unremarkable.   Subjective: Patient seen and examined at bedside. Patient doing much better. Mentally back to baseline. WBC up today. Afebrile, Denies SOB and chest pain.   Assessment & Plan: Acute metabolic encephalopathy - 2/2 to hyponatremia  -CT head is unremarkable. Patient back to baseline  -Continue to monitor   Acute hyponatremia -Likely in setting of poor oral intake, BP Meds  -Hold Hyzaar, HCTZ -Improving with IVF, will stop IVF today and monitor BMP in AM  -If Na normal will d/c in AM   Aspiration PNA  -Repeated CXR continues to small pleural effusion  -MRSA negative d/c vanco  -Will switch Zosyn to Augmentin, check porcalcitonin in AM and trend WBC - if stable will d/c home to complete 7 days of abx  -Blood cultures No growth UTD  -SLP pending   Essential HTN -Continue holding Hyzaar, HCTZ -PRN hydralazine  GERD -PPI  Recent hx of s/p of right hip replacement -PT OT  - recommending HHPT    DVT prophylaxis: Lovenox  Code Status: Partial  Family Communication: Son at bedside  Disposition Plan: Home in AM if CBC stable   Consultants:   None   Procedures:   None   Antimicrobials:  Vanco and Zosyn 3/5-3/8  Augmentin 3/8->   Objective: Vitals:   02/11/17 2056 02/12/17 0257 02/12/17 0445 02/12/17 1448    BP: (!) 148/58  133/65 (!) 143/62  Pulse: 83  87 92  Resp: 18  18 18   Temp: 98.8 F (37.1 C)  97.7 F (36.5 C) 98 F (36.7 C)  TempSrc: Oral  Oral Oral  SpO2: (!) 79% 95% 95% 92%  Weight:      Height:        Intake/Output Summary (Last 24 hours) at 02/12/17 1648 Last data filed at 02/12/17 1500  Gross per 24 hour  Intake             1850 ml  Output             3200 ml  Net            -1350 ml   Filed Weights   02/09/17 1610 02/10/17 1500  Weight: 99.8 kg (220 lb) 100.1 kg (220 lb 10.9 oz)    Examination:  General exam: Appears calm and comfortable  Respiratory system: Slight decrease air entry at the left lower base, mild rales at the left side.  Cardiovascular system: S1 & S2 heard, RRR. No JVD, murmurs, rubs or gallops Gastrointestinal system: Abdomen is nondistended, soft and nontender. Central nervous system: Alert and oriented.  Extremities: No pedal edema.    Skin: No rashes, lesions or ulcers Psychiatry: Judgement and insight appear normal. Mood & affect appropriate.    Data Reviewed: I have personally reviewed following labs and imaging studies  CBC:  Recent Labs Lab 02/06/17 0507 02/09/17 1816 02/11/17 0523 02/12/17 0509  WBC 13.8* 18.5* 12.9* 14.9*  NEUTROABS  --  14.9*  9.1*  --   HGB 11.7* 10.8* 10.3* 10.3*  HCT 34.3* 29.4* 29.1* 29.4*  MCV 94.5 89.1 92.1 93.3  PLT 229 302 306 784   Basic Metabolic Panel:  Recent Labs Lab 02/09/17 2330 02/10/17 0600 02/10/17 1152 02/10/17 1903 02/11/17 0523 02/12/17 0509  NA 119* 122* 126* 125*  --  134*  K 3.8 3.4* 4.5 3.6  --  3.7  CL 85* 89* 92* 92*  --  101  CO2 26 24 27 27   --  28  GLUCOSE 107* 121* 104* 104*  --  116*  BUN 16 15 16 19   --  12  CREATININE 0.79 0.79 0.76 0.77 0.80 0.79  CALCIUM 8.7* 8.3* 8.9 8.4*  --  8.3*   GFR: Estimated Creatinine Clearance: 87.3 mL/min (by C-G formula based on SCr of 0.79 mg/dL). Liver Function Tests:  Recent Labs Lab 02/09/17 1816  AST 50*  ALT 24   ALKPHOS 57  BILITOT 1.5*  PROT 6.7  ALBUMIN 3.8    Recent Labs Lab 02/09/17 2330  LIPASE 20   No results for input(s): AMMONIA in the last 168 hours. Coagulation Profile: No results for input(s): INR, PROTIME in the last 168 hours. Cardiac Enzymes:  Recent Labs Lab 02/09/17 2330 02/10/17 0600 02/10/17 1025  TROPONINI 0.03* 0.03* 0.03*   BNP (last 3 results) No results for input(s): PROBNP in the last 8760 hours. HbA1C: No results for input(s): HGBA1C in the last 72 hours. CBG:  Recent Labs Lab 02/09/17 1621 02/10/17 0840 02/11/17 0739 02/12/17 0732  GLUCAP 139* 110* 113* 112*   Lipid Profile: No results for input(s): CHOL, HDL, LDLCALC, TRIG, CHOLHDL, LDLDIRECT in the last 72 hours. Thyroid Function Tests:  Recent Labs  02/10/17 0600  TSH 1.297   Anemia Panel: No results for input(s): VITAMINB12, FOLATE, FERRITIN, TIBC, IRON, RETICCTPCT in the last 72 hours. Sepsis Labs:  Recent Labs Lab 02/10/17 0215  LATICACIDVEN 1.1    Recent Results (from the past 240 hour(s))  Urine culture     Status: None   Collection Time: 02/09/17  5:18 PM  Result Value Ref Range Status   Specimen Description URINE, RANDOM  Final   Special Requests NONE  Final   Culture   Final    NO GROWTH Performed at [facility], [address], [place], [place] [address]    Report Status 02/11/2017 FINAL  Final  MRSA PCR Screening     Status: None   Collection Time: 02/12/17  8:01 AM  Result Value Ref Range Status   MRSA by PCR NEGATIVE NEGATIVE Final    Comment:        The GeneXpert MRSA Assay (FDA approved for NASAL specimens only), is one component of a comprehensive MRSA colonization surveillance program. It is not intended to diagnose MRSA infection nor to guide or monitor treatment for MRSA infections.       Radiology Studies: Dg Chest 2 View  Result Date: 02/12/2017 CLINICAL DATA:  Recent diagnosis of pneumonia, followup, smoking history EXAM: CHEST  2  VIEW COMPARISON:  Chest x-ray of 02/09/2017 FINDINGS: No pneumonia is seen. However the does appear to be a small effusion probably on the left. Mediastinal and hilar contours are unremarkable. Mild cardiomegaly is stable. There are degenerative changes throughout the thoracic spine. IMPRESSION: No pneumonia.  Tiny left pleural effusion remains. Electronically Signed   By: Ivar Drape M.D.   On: 02/12/2017 12:03     Scheduled Meds: . enoxaparin (LOVENOX) injection  40 mg Subcutaneous Q24H  . famotidine (PEPCID) IV  20 mg Intravenous Q12H  . piperacillin-tazobactam (ZOSYN)  IV  3.375 g Intravenous Q8H  . sodium chloride flush  3 mL Intravenous Q12H   Continuous Infusions:   LOS: 3 days    Chipper Oman, MD Pager: Text Page via [email or web address]  [phone]  If 7PM-7AM, please contact night-coverage [email or web address] Password [facility] 02/12/2017, 4:48 PM

## 2017-02-12 NOTE — Progress Notes (Signed)
Physical Therapy Treatment Patient Details Name: [name]. MRN: [id number] DOB: 1934/03/11 Today's Date: 02/12/2017    History of Present Illness Pt is an 83 year old male adm  with encephalopathy, recent R direct anterior THA; PMhx of L THA with multiple left hip surgeries, recent cardiac cath with R radial artery     PT Comments    POD # 9    R THR readmitted for encephalopathy.  Assisted with amb a great distance in hallway and performed some THR TE's followed by ICE.  Pt progressing well with mobility.   Follow Up Recommendations  Home health PT;Supervision/Assistance - 24 hour     Equipment Recommendations  None recommended by PT    Recommendations for Other Services       Precautions / Restrictions Precautions Precautions: Fall Precaution Comments: AMS Restrictions RUE Weight Bearing: Weight bearing as tolerated    Mobility  Bed Mobility Overal bed mobility: Needs Assistance Bed Mobility: Sit to Supine       Sit to supine: Min guard   General bed mobility comments: able to self perform with increased time  Transfers Overall transfer level: Needs assistance Equipment used: Rolling walker (2 wheeled) Transfers: Sit to/from Stand Sit to Stand: Min guard;Supervision         General transfer comment: good safety tech and use of hands  Ambulation/Gait Ambulation/Gait assistance: Supervision;Min guard Ambulation Distance (Feet): 120 Feet Assistive device: Rolling walker (2 wheeled) Gait Pattern/deviations: Step-through pattern Gait velocity: decreased   General Gait Details: good alternating gait   Stairs            Wheelchair Mobility    Modified Rankin (Stroke Patients Only)       Balance                                    Cognition Arousal/Alertness: Awake/alert Behavior During Therapy: WFL for tasks assessed/performed Overall Cognitive Status: Within Functional Limits for tasks assessed                       Exercises      General Comments        Pertinent Vitals/Pain Pain Assessment: 0-10 Pain Score: 3  Pain Location: R thigh Pain Descriptors / Indicators: Discomfort;Grimacing Pain Intervention(s): Monitored during session;Repositioned;Ice applied    Home Living                      Prior Function            PT Goals (current goals can now be found in the care plan section) Progress towards PT goals: Progressing toward goals    Frequency    Min 4X/week      PT Plan Current plan remains appropriate    Co-evaluation             End of Session Equipment Utilized During Treatment: Gait belt Activity Tolerance: Patient tolerated treatment well Patient left: with call bell/phone within reach;with family/visitor present;in bed Nurse Communication: Mobility status PT Visit Diagnosis: Other abnormalities of gait and mobility (R26.89);Pain     Time: 8242-3536 PT Time Calculation (min) (ACUTE ONLY): 20 min  Charges:  $Gait Training: 8-22 mins                    G Codes:       Rica Koyanagi  PTA [facility]  Acute  Rehab Pager      [phone]

## 2017-02-13 DIAGNOSIS — J690 Pneumonitis due to inhalation of food and vomit: Secondary | ICD-10-CM

## 2017-02-13 LAB — BASIC METABOLIC PANEL
ANION GAP: 7 (ref 5–15)
BUN: 13 mg/dL (ref 6–20)
CHLORIDE: 100 mmol/L — AB (ref 101–111)
CO2: 29 mmol/L (ref 22–32)
Calcium: 8.6 mg/dL — ABNORMAL LOW (ref 8.9–10.3)
Creatinine, Ser: 0.81 mg/dL (ref 0.61–1.24)
GFR calc Af Amer: >60 mL/min (ref >60)
GFR calc non Af Amer: >60 mL/min (ref >60)
Glucose, Bld: 113 mg/dL — ABNORMAL HIGH (ref 65–99)
Potassium: 3.8 mmol/L (ref 3.5–5.1)
Sodium: 136 mmol/L (ref 135–145)

## 2017-02-13 LAB — CBC WITH DIFFERENTIAL/PLATELET
Basophils Absolute: 0.1 10*3/uL (ref 0.0–0.1)
Basophils Relative: 1 %
Eosinophils Absolute: 0.7 10*3/uL (ref 0.0–0.7)
Eosinophils Relative: 5 %
HEMATOCRIT: 30.9 % — AB (ref 39.0–52.0)
HEMOGLOBIN: 10.7 g/dL — AB (ref 13.0–17.0)
LYMPHS ABS: 1.5 10*3/uL (ref 0.7–4.0)
Lymphocytes Relative: 11 %
MCH: 33.1 pg (ref 26.0–34.0)
MCHC: 34.6 g/dL (ref 30.0–36.0)
MCV: 95.7 fL (ref 78.0–100.0)
MONOS PCT: 10 %
Monocytes Absolute: 1.4 10*3/uL — ABNORMAL HIGH (ref 0.1–1.0)
NEUTROS ABS: 10.1 10*3/uL — AB (ref 1.7–7.7)
NEUTROS PCT: 73 %
Platelets: 428 10*3/uL — ABNORMAL HIGH (ref 150–400)
RBC: 3.23 MIL/uL — ABNORMAL LOW (ref 4.22–5.81)
RDW: 12.7 % (ref 11.5–15.5)
WBC: 13.9 10*3/uL — ABNORMAL HIGH (ref 4.0–10.5)

## 2017-02-13 LAB — PROCALCITONIN: Procalcitonin: <0.1 ng/mL

## 2017-02-13 LAB — GLUCOSE, CAPILLARY: Glucose-Capillary: 116 mg/dL — ABNORMAL HIGH (ref 65–99)

## 2017-02-13 MED ORDER — FAMOTIDINE 20 MG PO TABS
20.0000 mg | ORAL_TABLET | Freq: Two times a day (BID) | ORAL | Status: DC
  Administered 2017-02-13: 20 mg via ORAL
  Filled 2017-02-13: qty 1

## 2017-02-13 MED ORDER — LOSARTAN POTASSIUM 50 MG PO TABS: 50.0000 mg | ORAL_TABLET | Freq: Every day | ORAL | 0 refills | Status: DC

## 2017-02-13 MED ORDER — ASPIRIN EC 81 MG PO TBEC: 81.0000 mg | DELAYED_RELEASE_TABLET | Freq: Every day | ORAL | 0 refills | Status: DC

## 2017-02-13 MED ORDER — AMOXICILLIN-POT CLAVULANATE 875-125 MG PO TABS: 1.0000 | ORAL_TABLET | Freq: Two times a day (BID) | ORAL | 0 refills | Status: AC

## 2017-02-13 NOTE — Progress Notes (Signed)
  Physical Therapy Treatment Patient Details Name: Jefte Carithers. MRN: [id number] DOB: 11/11/1934 Today's Date: 02/13/2017    History of Present Illness Pt is an 83 year old male adm  with encephalopathy, recent R direct anterior THA; PMhx of L THA with multiple left hip surgeries, recent cardiac cath with R radial artery     PT Comments    Pt ambulated good distance in hallway and performed a couple exercises.  Pt reports d/c home later today.   Follow Up Recommendations  Home health PT;Supervision/Assistance - 24 hour     Equipment Recommendations  None recommended by PT    Recommendations for Other Services       Precautions / Restrictions Precautions Precautions: Fall Restrictions Weight Bearing Restrictions: No RUE Weight Bearing: Weight bearing as tolerated    Mobility  Bed Mobility Overal bed mobility: Needs Assistance Bed Mobility: Supine to Sit     Supine to sit: Supervision     General bed mobility comments: able to self perform with increased time  Transfers Overall transfer level: Needs assistance Equipment used: Rolling walker (2 wheeled) Transfers: Sit to/from Stand Sit to Stand: Supervision         General transfer comment: good safety tech  Ambulation/Gait Ambulation/Gait assistance: Supervision Ambulation Distance (Feet): 240 Feet Assistive device: Rolling walker (2 wheeled) Gait Pattern/deviations: Step-through pattern     General Gait Details: good alternating gait, cues for posture   Stairs            Wheelchair Mobility    Modified Rankin (Stroke Patients Only)       Balance                                    Cognition Arousal/Alertness: Awake/alert Behavior During Therapy: WFL for tasks assessed/performed Overall Cognitive Status: Within Functional Limits for tasks assessed                      Exercises Total Joint Exercises Heel Slides: AAROM;Right;10 reps Hip ABduction/ADduction:  AAROM;Right;10 reps Long Arc Quad: AROM;Right;10 reps    General Comments        Pertinent Vitals/Pain Pain Assessment: 0-10 Pain Score: 4  Pain Location: R thigh Pain Descriptors / Indicators: Tightness Pain Intervention(s): Limited activity within patient's tolerance;Monitored during session;Repositioned    Home Living                      Prior Function            PT Goals (current goals can now be found in the care plan section) Progress towards PT goals: Progressing toward goals    Frequency    Min 4X/week      PT Plan Current plan remains appropriate    Co-evaluation             End of Session   Activity Tolerance: Patient tolerated treatment well Patient left: with call bell/phone within reach;in chair Nurse Communication: Mobility status PT Visit Diagnosis: Other abnormalities of gait and mobility (R26.89)     Time: 0539-7673 PT Time Calculation (min) (ACUTE ONLY): 15 min  Charges:  $Gait Training: 8-22 mins                    G Codes:       Dakota Vanwart,KATHrine E 02/13/2017, 12:33 PM Carmelia Bake, PT, DPT 02/13/2017 Pager: [phone]

## 2017-02-13 NOTE — Discharge Summary (Addendum)
Physician Discharge Summary  [name].  LOV:[id number]  DOB: 15-May-1934  DOA: 02/09/2017 PCP: Gerrit Heck, MD  Admit date: 02/09/2017 Discharge date: 02/13/2017  Admitted From: Home Disposition: Home   Recommendations for Outpatient Follow-up:  1. Follow up with PCP in 1-2 weeks 2. Please obtain BMP/CBC in one week to follow-up sodium and hemoglobin 3. Please follow up on the following pending results: Final blood cultures 4. Repeat chest x-ray to assure resolution of pleural effusion.   Discharge Condition: Stable  CODE STATUS: FULL   Diet recommendation: Heart Healthy   Brief/Interim Summary: [name] Jr.is a 83 y.o.malewith medical history significant of hypertension, hyperlipidemia, GERD, BPH, diverticulosis, anemia, ankylosing spondylosis, Barrett's esophagitis, recent right hip replacement (02/04/17, on Xarelto for DVT PPX), who presents with altered mental status. Per patient's son and daughter, patient underwent right hip replacement on 02/04/17 and was doing well until 3 days PTA which he became intermittently confused. Work up revealed hyponatremia of 117. CT head unremarkable. He was admitted for IV fluids and neuro checks. Patient also was found to have possible aspiration pneumonia and was started on vancomycin and Zosyn. Patient subsequently improve mentally and came back to his normal baseline after improvement of sodium. Patient's blood pressure medications were held including ACE inhibitor and hydrochlorothiazide. Patient has remained afebrile and clinically improving and will be discharged home with follow-up with his primary care physician.  Subjective: Patient seen and examined this morning. Has no complaints. No acute events overnight denies chest pain, cough, palpitations and dizziness. Remains afebrile  Discharge Diagnoses/Hospital Course:  Acute metabolic encephalopathy - 2/2 to hyponatremia  CT head is unremarkable. Patient back to baseline   Follow-up with PCP in 1-2 weeks  Acute hyponatremia - resolved Likely in setting of poor oral intake, BP Meds  Treated with IV fluids and holding blood pressure medications Hydrochlorothiazide discontinued   Aspiration PNA, afebrile, WBC trending down. Clinically improving  Repeated CXR continues to shows small pleural effusion  Initially treated with Vanco and Zosyn switch to Augmentin to complete a total of 7 days of antibiotic treatment  MRSA negative, vanco d/ced Blood cultures No growth UTD  Repeat chest x-ray in 2-3 weeks to assure resolution of pleural effusion  Essential HTN - stable Losartan resume at higher dose - 50 mg daily Hydrochlorothiazide discontinued  GERD PPI  Recent hx of s/p of right hip replacement Home health PT, patient was on Xarelto for DVT prophylaxis prior to admission. That has been discontinued and patient has been discharged on aspirin 81 mg daily.  All other chronic medical condition were stable during the hospitalization.  Patient was seen by physical therapy, he commended home health PT On the day of the discharge the patient's vitals were stable, and no other acute medical condition were reported by patient.Patient was felt safe to be discharge to home  Discharge Instructions  You were cared for by a hospitalist during your hospital stay. If you have any questions about your discharge medications or the care you received while you were in the hospital after you are discharged, you can call the unit and asked to speak with the hospitalist on call if the hospitalist that took care of you is not available. Once you are discharged, your primary care physician will handle any further medical issues. Please note that NO REFILLS for any discharge medications will be authorized once you are discharged, as it is imperative that you return to your primary care physician (or establish a relationship  with a primary care physician if you do not have one) for  your aftercare needs so that they can reassess your need for medications and monitor your lab values.  Discharge Instructions    Call MD for:  difficulty breathing, headache or visual disturbances    Complete by:  As directed    Call MD for:  extreme fatigue    Complete by:  As directed    Call MD for:  hives    Complete by:  As directed    Call MD for:  persistant dizziness or light-headedness    Complete by:  As directed    Call MD for:  persistant nausea and vomiting    Complete by:  As directed    Call MD for:  redness, tenderness, or signs of infection (pain, swelling, redness, odor or green/yellow discharge around incision site)    Complete by:  As directed    Call MD for:  severe uncontrolled pain    Complete by:  As directed    Call MD for:  temperature >100.4    Complete by:  As directed    Diet - low sodium heart healthy    Complete by:  As directed    Increase activity slowly    Complete by:  As directed      Allergies as of 02/13/2017   No Known Allergies     Medication List    STOP taking these medications   hydrochlorothiazide 25 MG tablet Commonly known as:  HYDRODIURIL   losartan-hydrochlorothiazide 100-12.5 MG tablet Commonly known as:  HYZAAR   rivaroxaban 10 MG Tabs tablet Commonly known as:  XARELTO     TAKE these medications   amoxicillin-clavulanate 875-125 MG tablet Commonly known as:  AUGMENTIN Take 1 tablet by mouth every 12 (twelve) hours.   aspirin EC 81 MG tablet Take 1 tablet (81 mg total) by mouth daily.   atorvastatin 40 MG tablet Commonly known as:  LIPITOR Take 40 mg by mouth daily at 6 PM.   losartan 50 MG tablet Commonly known as:  COZAAR Take 1 tablet (50 mg total) by mouth daily.   meloxicam 7.5 MG tablet Commonly known as:  MOBIC Take 7.5 mg by mouth daily.   methocarbamol 500 MG tablet Commonly known as:  ROBAXIN Take 1 tablet (500 mg total) by mouth every 6 (six) hours as needed for muscle spasms.   omeprazole 40  MG capsule Commonly known as:  PRILOSEC Take 40 mg by mouth 2 (two) times daily.   oxyCODONE 5 MG immediate release tablet Commonly known as:  Oxy IR/ROXICODONE Take 1-2 tablets (5-10 mg total) by mouth every 4 (four) hours as needed for moderate pain or severe pain.   Potassium 99 MG Tabs Take 99 mg by mouth every evening.   tamsulosin 0.4 MG Caps capsule Commonly known as:  FLOMAX Take 0.4 mg by mouth 2 (two) times daily.   traMADol 50 MG tablet Commonly known as:  ULTRAM Take 1-2 tablets (50-100 mg total) by mouth every 6 (six) hours as needed for moderate pain.      Follow-up Information    Gerrit Heck, MD. Schedule an appointment as soon as possible for a visit in 1 week(s).   Specialty:  Family Medicine Contact information: [place] [address] [phone]          No Known Allergies  Consultations:  None    Procedures/Studies: Dg Chest 2 View  Result Date: 02/12/2017 CLINICAL DATA:  Recent diagnosis of pneumonia, followup, smoking history EXAM: CHEST  2 VIEW COMPARISON:  Chest x-ray of 02/09/2017 FINDINGS: No pneumonia is seen. However the does appear to be a small effusion probably on the left. Mediastinal and hilar contours are unremarkable. Mild cardiomegaly is stable. There are degenerative changes throughout the thoracic spine. IMPRESSION: No pneumonia.  Tiny left pleural effusion remains. Electronically Signed   By: Ivar Drape M.D.   On: 02/12/2017 12:03   Ct Head Wo Contrast  Result Date: 02/09/2017 CLINICAL DATA:  83 y/o  M; altered mental status. EXAM: CT HEAD WITHOUT CONTRAST TECHNIQUE: Contiguous axial images were obtained from the base of the skull through the vertex without intravenous contrast. COMPARISON:  None. FINDINGS: Brain: No evidence of acute infarction, hemorrhage, hydrocephalus, extra-axial collection or mass lesion/mass effect. Few foci of hypoattenuation in subcortical and periventricular white matter is  compatible with mild chronic microvascular ischemic changes. Mild brain parenchymal volume loss. Vascular: No hyperdense vessel or unexpected calcification. Skull: Normal. Negative for fracture or focal lesion. Sinuses/Orbits: No acute finding. Other: None. IMPRESSION: 1. No acute intracranial abnormality. 2. Mild chronic microvascular ischemic changes and mild parenchymal volume loss of the brain. Electronically Signed   By: Kristine Garbe M.D.   On: 02/09/2017 17:40   Dg Pelvis Portable  Result Date: 02/04/2017 CLINICAL DATA:  Status post right total hip replacement EXAM: PORTABLE PELVIS 1-2 VIEWS COMPARISON:  January 24, 2009 FINDINGS: Frontal lower pelvic image including hips obtained. There is now a total hip prosthesis on the right with prosthetic components well-seated. A surgical drain and soft tissue air noted on the right. Patient has had a previous total hip replacement on the left with prosthetic components well-seated. There is myositis ossificans lateral to the left hip joint, stable. No acute fracture or dislocation. IMPRESSION: Bilateral total hip replacements with prosthetic components well-seated bilaterally. Surgical drain and postoperative air noted on the right. Myositis ossificans lateral to the left hip joint is stable. No acute fracture or dislocation. Electronically Signed   By: Lowella Grip III M.D.   On: 02/04/2017 12:53   Dg Chest Port 1 View  Result Date: 02/09/2017 CLINICAL DATA:  Increased altered mental status EXAM: PORTABLE CHEST 1 VIEW COMPARISON:  11/16/2008 FINDINGS: Mild increased opacity at the left base, cannot exclude atelectasis, infiltrate or possible small effusion. Mild right basilar atelectasis. Mild cardiomegaly augmented by patient rotation. No overt edema. No pneumothorax. Degenerative changes of the right shoulder and AC joint IMPRESSION: Increased opacity at the left lung base, cannot exclude atelectasis or infiltrate. Possible small left  effusion. Mild right basilar atelectasis Mild cardiomegaly Electronically Signed   By: Donavan Foil M.D.   On: 02/09/2017 22:22   Dg C-arm 1-60 Min-no Report  Result Date: 02/04/2017 Fluoroscopy was utilized by the requesting physician.  No radiographic interpretation.     Discharge Exam: Vitals:   02/12/17 2030 02/13/17 0437  BP: (!) 155/73 (!) 150/61  Pulse: 93 79  Resp: 18 18  Temp: 98.4 F (36.9 C) 98 F (36.7 C)   Vitals:   02/12/17 0445 02/12/17 1448 02/12/17 2030 02/13/17 0437  BP: 133/65 (!) 143/62 (!) 155/73 (!) 150/61  Pulse: 87 92 93 79  Resp: 18 18 18 18   Temp: 97.7 F (36.5 C) 98 F (36.7 C) 98.4 F (36.9 C) 98 F (36.7 C)  TempSrc: Oral Oral Oral Oral  SpO2: 95% 92% 97% 93%  Weight:      Height:  General: Pt is alert, awake, not in acute distress Cardiovascular: RRR, S1/S2 +, no rubs, no gallops Respiratory: CTA bilaterally, no wheezing, no rhonchi Abdominal: Soft, NT, ND, bowel sounds + Extremities: no edema, no cyanosis   The results of significant diagnostics from this hospitalization (including imaging, microbiology, ancillary and laboratory) are listed below for reference.     Microbiology: Recent Results (from the past 240 hour(s))  Urine culture     Status: None   Collection Time: 02/09/17  5:18 PM  Result Value Ref Range Status   Specimen Description URINE, RANDOM  Final   Special Requests NONE  Final   Culture   Final    NO GROWTH Performed at [facility], [address] [place], [place] [address]    Report Status 02/11/2017 FINAL  Final  MRSA PCR Screening     Status: None   Collection Time: 02/12/17  8:01 AM  Result Value Ref Range Status   MRSA by PCR NEGATIVE NEGATIVE Final    Comment:        The GeneXpert MRSA Assay (FDA approved for NASAL specimens only), is one component of a comprehensive MRSA colonization surveillance program. It is not intended to diagnose MRSA infection nor to guide or monitor  treatment for MRSA infections.      Labs: BNP (last 3 results) No results for input(s): BNP in the last 8760 hours. Basic Metabolic Panel:  Recent Labs Lab 02/10/17 0600 02/10/17 1152 02/10/17 1903 02/11/17 0523 02/12/17 0509 02/13/17 0506  NA 122* 126* 125*  --  134* 136  K 3.4* 4.5 3.6  --  3.7 3.8  CL 89* 92* 92*  --  101 100*  CO2 24 27 27   --  28 29  GLUCOSE 121* 104* 104*  --  116* 113*  BUN 15 16 19   --  12 13  CREATININE 0.79 0.76 0.77 0.80 0.79 0.81  CALCIUM 8.3* 8.9 8.4*  --  8.3* 8.6*   Liver Function Tests:  Recent Labs Lab 02/09/17 1816  AST 50*  ALT 24  ALKPHOS 57  BILITOT 1.5*  PROT 6.7  ALBUMIN 3.8    Recent Labs Lab 02/09/17 2330  LIPASE 20   No results for input(s): AMMONIA in the last 168 hours. CBC:  Recent Labs Lab 02/09/17 1816 02/11/17 0523 02/12/17 0509 02/13/17 0506  WBC 18.5* 12.9* 14.9* 13.9*  NEUTROABS 14.9* 9.1*  --  10.1*  HGB 10.8* 10.3* 10.3* 10.7*  HCT 29.4* 29.1* 29.4* 30.9*  MCV 89.1 92.1 93.3 95.7  PLT 302 306 365 428*   Cardiac Enzymes:  Recent Labs Lab 02/09/17 2330 02/10/17 0600 02/10/17 1025  TROPONINI 0.03* 0.03* 0.03*   BNP: Invalid input(s): POCBNP CBG:  Recent Labs Lab 02/09/17 1621 02/10/17 0840 02/11/17 0739 02/12/17 0732 02/13/17 0748  GLUCAP 139* 110* 113* 112* 116*   D-Dimer No results for input(s): DDIMER in the last 72 hours. Hgb A1c No results for input(s): HGBA1C in the last 72 hours. Lipid Profile No results for input(s): CHOL, HDL, LDLCALC, TRIG, CHOLHDL, LDLDIRECT in the last 72 hours. Thyroid function studies No results for input(s): TSH, T4TOTAL, T3FREE, THYROIDAB in the last 72 hours.  Invalid input(s): FREET3 Anemia work up No results for input(s): VITAMINB12, FOLATE, FERRITIN, TIBC, IRON, RETICCTPCT in the last 72 hours. Urinalysis    Component Value Date/Time   COLORURINE YELLOW 02/09/2017 1718   APPEARANCEUR CLEAR 02/09/2017 1718   LABSPEC 1.014 02/09/2017  1718   PHURINE 7.0 02/09/2017 1718  GLUCOSEU NEGATIVE 02/09/2017 1718   HGBUR NEGATIVE 02/09/2017 1718   BILIRUBINUR NEGATIVE 02/09/2017 1718   KETONESUR NEGATIVE 02/09/2017 1718   PROTEINUR NEGATIVE 02/09/2017 1718   UROBILINOGEN 0.2 11/16/2008 0924   NITRITE NEGATIVE 02/09/2017 1718   LEUKOCYTESUR NEGATIVE 02/09/2017 1718   Sepsis Labs Invalid input(s): PROCALCITONIN,  WBC,  LACTICIDVEN Microbiology Recent Results (from the past 240 hour(s))  Urine culture     Status: None   Collection Time: 02/09/17  5:18 PM  Result Value Ref Range Status   Specimen Description URINE, RANDOM  Final   Special Requests NONE  Final   Culture   Final    NO GROWTH Performed at [facility], [place] [address], [place], [place] [address]    Report Status 02/11/2017 FINAL  Final  MRSA PCR Screening     Status: None   Collection Time: 02/12/17  8:01 AM  Result Value Ref Range Status   MRSA by PCR NEGATIVE NEGATIVE Final    Comment:        The GeneXpert MRSA Assay (FDA approved for NASAL specimens only), is one component of a comprehensive MRSA colonization surveillance program. It is not intended to diagnose MRSA infection nor to guide or monitor treatment for MRSA infections.      Time coordinating discharge: 35 minutes  SIGNED:  Chipper Oman, MD  [facility] 02/13/2017, 11:13 AM  Pager please text page via  [email or web address] Password [email or web address]

## 2017-02-14 DIAGNOSIS — I10 Essential (primary) hypertension: Secondary | ICD-10-CM | POA: Diagnosis not present

## 2017-02-14 DIAGNOSIS — K2270 Barrett's esophagus without dysplasia: Secondary | ICD-10-CM | POA: Diagnosis not present

## 2017-02-14 DIAGNOSIS — D649 Anemia, unspecified: Secondary | ICD-10-CM | POA: Diagnosis not present

## 2017-02-14 DIAGNOSIS — Z471 Aftercare following joint replacement surgery: Secondary | ICD-10-CM | POA: Diagnosis not present

## 2017-02-14 DIAGNOSIS — I739 Peripheral vascular disease, unspecified: Secondary | ICD-10-CM | POA: Diagnosis not present

## 2017-02-14 DIAGNOSIS — M1991 Primary osteoarthritis, unspecified site: Secondary | ICD-10-CM | POA: Diagnosis not present

## 2017-02-17 DIAGNOSIS — I714 Abdominal aortic aneurysm, without rupture: Secondary | ICD-10-CM | POA: Diagnosis not present

## 2017-02-17 DIAGNOSIS — I10 Essential (primary) hypertension: Secondary | ICD-10-CM | POA: Diagnosis not present

## 2017-02-17 DIAGNOSIS — E7800 Pure hypercholesterolemia, unspecified: Secondary | ICD-10-CM | POA: Diagnosis not present

## 2017-02-17 DIAGNOSIS — Z8669 Personal history of other diseases of the nervous system and sense organs: Secondary | ICD-10-CM | POA: Diagnosis not present

## 2017-02-18 DIAGNOSIS — I739 Peripheral vascular disease, unspecified: Secondary | ICD-10-CM | POA: Diagnosis not present

## 2017-02-18 DIAGNOSIS — I10 Essential (primary) hypertension: Secondary | ICD-10-CM | POA: Diagnosis not present

## 2017-02-18 DIAGNOSIS — M1991 Primary osteoarthritis, unspecified site: Secondary | ICD-10-CM | POA: Diagnosis not present

## 2017-02-18 DIAGNOSIS — D649 Anemia, unspecified: Secondary | ICD-10-CM | POA: Diagnosis not present

## 2017-02-18 DIAGNOSIS — Z471 Aftercare following joint replacement surgery: Secondary | ICD-10-CM | POA: Diagnosis not present

## 2017-02-18 DIAGNOSIS — K2270 Barrett's esophagus without dysplasia: Secondary | ICD-10-CM | POA: Diagnosis not present

## 2017-02-20 DIAGNOSIS — D649 Anemia, unspecified: Secondary | ICD-10-CM | POA: Diagnosis not present

## 2017-02-20 DIAGNOSIS — I739 Peripheral vascular disease, unspecified: Secondary | ICD-10-CM | POA: Diagnosis not present

## 2017-02-20 DIAGNOSIS — I10 Essential (primary) hypertension: Secondary | ICD-10-CM | POA: Diagnosis not present

## 2017-02-20 DIAGNOSIS — K2270 Barrett's esophagus without dysplasia: Secondary | ICD-10-CM | POA: Diagnosis not present

## 2017-02-20 DIAGNOSIS — Z471 Aftercare following joint replacement surgery: Secondary | ICD-10-CM | POA: Diagnosis not present

## 2017-02-20 DIAGNOSIS — M1991 Primary osteoarthritis, unspecified site: Secondary | ICD-10-CM | POA: Diagnosis not present

## 2017-02-24 DIAGNOSIS — I10 Essential (primary) hypertension: Secondary | ICD-10-CM | POA: Diagnosis not present

## 2017-02-24 DIAGNOSIS — I739 Peripheral vascular disease, unspecified: Secondary | ICD-10-CM | POA: Diagnosis not present

## 2017-02-24 DIAGNOSIS — M1991 Primary osteoarthritis, unspecified site: Secondary | ICD-10-CM | POA: Diagnosis not present

## 2017-02-24 DIAGNOSIS — K2270 Barrett's esophagus without dysplasia: Secondary | ICD-10-CM | POA: Diagnosis not present

## 2017-02-24 DIAGNOSIS — D649 Anemia, unspecified: Secondary | ICD-10-CM | POA: Diagnosis not present

## 2017-02-24 DIAGNOSIS — Z471 Aftercare following joint replacement surgery: Secondary | ICD-10-CM | POA: Diagnosis not present

## 2017-02-25 DIAGNOSIS — C44319 Basal cell carcinoma of skin of other parts of face: Secondary | ICD-10-CM | POA: Diagnosis not present

## 2017-02-26 DIAGNOSIS — Z471 Aftercare following joint replacement surgery: Secondary | ICD-10-CM | POA: Diagnosis not present

## 2017-02-26 DIAGNOSIS — K2270 Barrett's esophagus without dysplasia: Secondary | ICD-10-CM | POA: Diagnosis not present

## 2017-02-26 DIAGNOSIS — I10 Essential (primary) hypertension: Secondary | ICD-10-CM | POA: Diagnosis not present

## 2017-02-26 DIAGNOSIS — D649 Anemia, unspecified: Secondary | ICD-10-CM | POA: Diagnosis not present

## 2017-02-26 DIAGNOSIS — M1991 Primary osteoarthritis, unspecified site: Secondary | ICD-10-CM | POA: Diagnosis not present

## 2017-02-26 DIAGNOSIS — I739 Peripheral vascular disease, unspecified: Secondary | ICD-10-CM | POA: Diagnosis not present

## 2017-03-03 DIAGNOSIS — Z471 Aftercare following joint replacement surgery: Secondary | ICD-10-CM | POA: Diagnosis not present

## 2017-03-03 DIAGNOSIS — D649 Anemia, unspecified: Secondary | ICD-10-CM | POA: Diagnosis not present

## 2017-03-03 DIAGNOSIS — K2270 Barrett's esophagus without dysplasia: Secondary | ICD-10-CM | POA: Diagnosis not present

## 2017-03-03 DIAGNOSIS — I739 Peripheral vascular disease, unspecified: Secondary | ICD-10-CM | POA: Diagnosis not present

## 2017-03-03 DIAGNOSIS — M1991 Primary osteoarthritis, unspecified site: Secondary | ICD-10-CM | POA: Diagnosis not present

## 2017-03-03 DIAGNOSIS — I10 Essential (primary) hypertension: Secondary | ICD-10-CM | POA: Diagnosis not present

## 2017-03-05 DIAGNOSIS — K2270 Barrett's esophagus without dysplasia: Secondary | ICD-10-CM | POA: Diagnosis not present

## 2017-03-05 DIAGNOSIS — R7301 Impaired fasting glucose: Secondary | ICD-10-CM | POA: Diagnosis not present

## 2017-03-05 DIAGNOSIS — E7800 Pure hypercholesterolemia, unspecified: Secondary | ICD-10-CM | POA: Diagnosis not present

## 2017-03-05 DIAGNOSIS — Z8669 Personal history of other diseases of the nervous system and sense organs: Secondary | ICD-10-CM | POA: Diagnosis not present

## 2017-03-05 DIAGNOSIS — I714 Abdominal aortic aneurysm, without rupture: Secondary | ICD-10-CM | POA: Diagnosis not present

## 2017-03-05 DIAGNOSIS — M459 Ankylosing spondylitis of unspecified sites in spine: Secondary | ICD-10-CM | POA: Diagnosis not present

## 2017-03-05 DIAGNOSIS — I10 Essential (primary) hypertension: Secondary | ICD-10-CM | POA: Diagnosis not present

## 2017-03-06 DIAGNOSIS — D649 Anemia, unspecified: Secondary | ICD-10-CM | POA: Diagnosis not present

## 2017-03-06 DIAGNOSIS — Z471 Aftercare following joint replacement surgery: Secondary | ICD-10-CM | POA: Diagnosis not present

## 2017-03-06 DIAGNOSIS — M1991 Primary osteoarthritis, unspecified site: Secondary | ICD-10-CM | POA: Diagnosis not present

## 2017-03-06 DIAGNOSIS — K2270 Barrett's esophagus without dysplasia: Secondary | ICD-10-CM | POA: Diagnosis not present

## 2017-03-06 DIAGNOSIS — I739 Peripheral vascular disease, unspecified: Secondary | ICD-10-CM | POA: Diagnosis not present

## 2017-03-06 DIAGNOSIS — I10 Essential (primary) hypertension: Secondary | ICD-10-CM | POA: Diagnosis not present

## 2017-03-10 DIAGNOSIS — Z471 Aftercare following joint replacement surgery: Secondary | ICD-10-CM | POA: Diagnosis not present

## 2017-03-10 DIAGNOSIS — Z96641 Presence of right artificial hip joint: Secondary | ICD-10-CM | POA: Diagnosis not present

## 2017-04-22 ENCOUNTER — Other Ambulatory Visit: Payer: Self-pay | Admitting: Rheumatology

## 2017-04-22 DIAGNOSIS — M459 Ankylosing spondylitis of unspecified sites in spine: Secondary | ICD-10-CM

## 2017-05-11 DIAGNOSIS — L570 Actinic keratosis: Secondary | ICD-10-CM | POA: Diagnosis not present

## 2017-05-11 DIAGNOSIS — Z85828 Personal history of other malignant neoplasm of skin: Secondary | ICD-10-CM | POA: Diagnosis not present

## 2017-05-11 DIAGNOSIS — D1801 Hemangioma of skin and subcutaneous tissue: Secondary | ICD-10-CM | POA: Diagnosis not present

## 2017-05-11 DIAGNOSIS — L821 Other seborrheic keratosis: Secondary | ICD-10-CM | POA: Diagnosis not present

## 2017-05-11 DIAGNOSIS — D239 Other benign neoplasm of skin, unspecified: Secondary | ICD-10-CM | POA: Diagnosis not present

## 2017-05-11 NOTE — Addendum Note (Signed)
Addendum  created 05/11/17 1141 by Artur Winningham, MD   Sign clinical note    

## 2017-05-12 ENCOUNTER — Ambulatory Visit
Admission: RE | Admit: 2017-05-12 | Discharge: 2017-05-12 | Disposition: A | Discharge status: Home / Self Care | Payer: No Typology Code available for payment source | Source: Ambulatory Visit | Attending: Rheumatology | Admitting: Rheumatology

## 2017-05-12 ENCOUNTER — Other Ambulatory Visit: Payer: Medicare HMO

## 2017-05-12 ENCOUNTER — Other Ambulatory Visit: Payer: Self-pay | Admitting: Rheumatology

## 2017-05-12 DIAGNOSIS — M459 Ankylosing spondylitis of unspecified sites in spine: Secondary | ICD-10-CM

## 2017-05-13 ENCOUNTER — Other Ambulatory Visit: Payer: No Typology Code available for payment source

## 2017-05-13 ENCOUNTER — Other Ambulatory Visit: Payer: Self-pay | Admitting: Rheumatology

## 2017-05-13 ENCOUNTER — Inpatient Hospital Stay: Admission: RE | Admit: 2017-05-13 | Payer: No Typology Code available for payment source | Source: Ambulatory Visit

## 2017-05-13 DIAGNOSIS — M459 Ankylosing spondylitis of unspecified sites in spine: Secondary | ICD-10-CM

## 2017-05-27 DIAGNOSIS — I10 Essential (primary) hypertension: Secondary | ICD-10-CM | POA: Diagnosis not present

## 2017-05-27 DIAGNOSIS — L731 Pseudofolliculitis barbae: Secondary | ICD-10-CM | POA: Diagnosis not present

## 2017-06-04 DIAGNOSIS — I10 Essential (primary) hypertension: Secondary | ICD-10-CM | POA: Diagnosis not present

## 2017-07-14 DIAGNOSIS — H20022 Recurrent acute iridocyclitis, left eye: Secondary | ICD-10-CM | POA: Diagnosis not present

## 2017-07-14 DIAGNOSIS — H01025 Squamous blepharitis left lower eyelid: Secondary | ICD-10-CM | POA: Diagnosis not present

## 2017-07-14 DIAGNOSIS — H2511 Age-related nuclear cataract, right eye: Secondary | ICD-10-CM | POA: Diagnosis not present

## 2017-07-14 DIAGNOSIS — Z961 Presence of intraocular lens: Secondary | ICD-10-CM | POA: Diagnosis not present

## 2017-07-14 DIAGNOSIS — H01022 Squamous blepharitis right lower eyelid: Secondary | ICD-10-CM | POA: Diagnosis not present

## 2017-07-14 DIAGNOSIS — H01024 Squamous blepharitis left upper eyelid: Secondary | ICD-10-CM | POA: Diagnosis not present

## 2017-07-14 DIAGNOSIS — H01021 Squamous blepharitis right upper eyelid: Secondary | ICD-10-CM | POA: Diagnosis not present

## 2017-08-03 ENCOUNTER — Telehealth: Payer: Self-pay | Admitting: Gastroenterology

## 2017-08-03 NOTE — Telephone Encounter (Signed)
I reviewed his cardiac cath note from Feb 2018 and his current med list.  He can be directly booked for an EGD and remain on aspirin for the procedure.  Thanks for checking.

## 2017-08-03 NOTE — Telephone Encounter (Signed)
Please advise if okay to schedule EGD. Thanks.

## 2017-08-04 NOTE — Telephone Encounter (Signed)
Spoke to patient, he is scheduled for EGD at [facility] on 08/25/17 and pre-visit on 08/11/17.

## 2017-08-04 NOTE — Telephone Encounter (Signed)
Left message for patient to call back, he may be scheduled for EGD at [facility] and will also need to see the pre-visit nurse. Dr. Loletha Carrow ok'd to continue on his aspirin for the procedure.

## 2017-08-11 ENCOUNTER — Ambulatory Visit (AMBULATORY_SURGERY_CENTER): Payer: Self-pay

## 2017-08-11 ENCOUNTER — Encounter: Payer: Self-pay | Admitting: Gastroenterology

## 2017-08-11 VITALS — Ht 71.0 in | Wt 230.2 lb

## 2017-08-11 DIAGNOSIS — K2270 Barrett's esophagus without dysplasia: Secondary | ICD-10-CM

## 2017-08-11 NOTE — Progress Notes (Signed)
Per pt, no allergies to soy or egg products.Pt not taking any weight loss meds or using  O2 at home.   Pt refused Emmi video. 

## 2017-08-13 DIAGNOSIS — L989 Disorder of the skin and subcutaneous tissue, unspecified: Secondary | ICD-10-CM | POA: Diagnosis not present

## 2017-08-13 DIAGNOSIS — Z8614 Personal history of Methicillin resistant Staphylococcus aureus infection: Secondary | ICD-10-CM | POA: Diagnosis not present

## 2017-08-18 ENCOUNTER — Telehealth: Payer: Self-pay | Admitting: Gastroenterology

## 2017-08-18 NOTE — Telephone Encounter (Signed)
Routed to Dr. Loletha Carrow, please advise. Scheduled for EGD on 9/18 at [facility]. Thank you.

## 2017-08-18 NOTE — Telephone Encounter (Signed)
His procedure is routine for Barrett's surveillance, so it would be best to cancel it. Also, since I last saw him in Dec 2017 and has active medical issues, please arrange for him to see me in clinic in late Oct or early Nov.  - HD

## 2017-08-18 NOTE — Telephone Encounter (Signed)
Spoke to patient's daughter, who relayed message that will cancel EGD for now and Dr. Loletha Carrow would like to see him in the office. Appointment is scheduled for 10/13/17, needed a Tuesday so daughter could come with him.

## 2017-08-21 DIAGNOSIS — Z8614 Personal history of Methicillin resistant Staphylococcus aureus infection: Secondary | ICD-10-CM | POA: Diagnosis not present

## 2017-08-21 DIAGNOSIS — L989 Disorder of the skin and subcutaneous tissue, unspecified: Secondary | ICD-10-CM | POA: Diagnosis not present

## 2017-08-21 DIAGNOSIS — I10 Essential (primary) hypertension: Secondary | ICD-10-CM | POA: Diagnosis not present

## 2017-08-25 ENCOUNTER — Encounter: Payer: Medicare HMO | Admitting: Gastroenterology

## 2017-09-07 DIAGNOSIS — H11822 Conjunctivochalasis, left eye: Secondary | ICD-10-CM | POA: Diagnosis not present

## 2017-09-07 DIAGNOSIS — H01025 Squamous blepharitis left lower eyelid: Secondary | ICD-10-CM | POA: Diagnosis not present

## 2017-09-07 DIAGNOSIS — H43811 Vitreous degeneration, right eye: Secondary | ICD-10-CM | POA: Diagnosis not present

## 2017-09-07 DIAGNOSIS — H20022 Recurrent acute iridocyclitis, left eye: Secondary | ICD-10-CM | POA: Diagnosis not present

## 2017-09-07 DIAGNOSIS — H2511 Age-related nuclear cataract, right eye: Secondary | ICD-10-CM | POA: Diagnosis not present

## 2017-09-07 DIAGNOSIS — H01021 Squamous blepharitis right upper eyelid: Secondary | ICD-10-CM | POA: Diagnosis not present

## 2017-09-07 DIAGNOSIS — H43812 Vitreous degeneration, left eye: Secondary | ICD-10-CM | POA: Diagnosis not present

## 2017-09-07 DIAGNOSIS — H01022 Squamous blepharitis right lower eyelid: Secondary | ICD-10-CM | POA: Diagnosis not present

## 2017-09-07 DIAGNOSIS — H01024 Squamous blepharitis left upper eyelid: Secondary | ICD-10-CM | POA: Diagnosis not present

## 2017-09-15 DIAGNOSIS — R7301 Impaired fasting glucose: Secondary | ICD-10-CM | POA: Diagnosis not present

## 2017-09-15 DIAGNOSIS — M459 Ankylosing spondylitis of unspecified sites in spine: Secondary | ICD-10-CM | POA: Diagnosis not present

## 2017-09-15 DIAGNOSIS — K2270 Barrett's esophagus without dysplasia: Secondary | ICD-10-CM | POA: Diagnosis not present

## 2017-09-15 DIAGNOSIS — K219 Gastro-esophageal reflux disease without esophagitis: Secondary | ICD-10-CM | POA: Diagnosis not present

## 2017-09-15 DIAGNOSIS — M489 Spondylopathy, unspecified: Secondary | ICD-10-CM | POA: Diagnosis not present

## 2017-09-15 DIAGNOSIS — I10 Essential (primary) hypertension: Secondary | ICD-10-CM | POA: Diagnosis not present

## 2017-09-15 DIAGNOSIS — Z23 Encounter for immunization: Secondary | ICD-10-CM | POA: Diagnosis not present

## 2017-09-15 DIAGNOSIS — E7800 Pure hypercholesterolemia, unspecified: Secondary | ICD-10-CM | POA: Diagnosis not present

## 2017-09-15 DIAGNOSIS — Z0000 Encounter for general adult medical examination without abnormal findings: Secondary | ICD-10-CM | POA: Diagnosis not present

## 2017-10-13 ENCOUNTER — Ambulatory Visit: Payer: Medicare HMO | Admitting: Gastroenterology

## 2017-10-13 ENCOUNTER — Encounter (INDEPENDENT_AMBULATORY_CARE_PROVIDER_SITE_OTHER): Payer: Self-pay

## 2017-10-13 ENCOUNTER — Encounter: Payer: Self-pay | Admitting: Gastroenterology

## 2017-10-13 VITALS — BP 180/80 | HR 74 | Ht 71.0 in | Wt 233.8 lb

## 2017-10-13 DIAGNOSIS — K449 Diaphragmatic hernia without obstruction or gangrene: Secondary | ICD-10-CM

## 2017-10-13 DIAGNOSIS — K2270 Barrett's esophagus without dysplasia: Secondary | ICD-10-CM

## 2017-10-13 NOTE — Progress Notes (Signed)
     [facility] Progress Note  Chief Complaint: Barrett's esophagus  Subjective  History:  This is an 83 year old man I last saw in December 2017 for history of Barrett's esophagus. Dr. Sharlett Fuentes had been following him for many years. [name] was to have an upper endoscopy with me this past spring, but he decided to postpone it for hip replacement. He then had a prolonged recovery with a wound infection. Presently, he denies heartburn as long as he takes [name], and has been doing so twice daily for years. It is difficult to tell if he has dysphagia, as he reports that some foods feel "a little rough" in the neck when he swallows. He denies chronic abdominal pain or altered bowel habits or rectal bleeding.  ROS: Cardiovascular:  no chest pain Respiratory: no dyspnea  The patient's Past Medical, Family and Social History were reviewed and are on file in the EMR.  Objective:  Med list reviewed  Current Outpatient Medications:  .  aspirin EC 81 MG tablet, Take 1 tablet (81 mg total) by mouth daily., Disp: 30 tablet, Rfl: 0 .  atorvastatin (LIPITOR) 40 MG tablet, Take 40 mg by mouth daily at 6 PM. , Disp: , Rfl:  .  losartan (COZAAR) 50 MG tablet, Take 1 tablet (50 mg total) by mouth daily. (Patient taking differently: Take 50 mg by mouth daily. Take 2 pills daily), Disp: 30 tablet, Rfl: 0 .  meloxicam (MOBIC) 7.5 MG tablet, Take 7.5 mg by mouth daily., Disp: , Rfl:  .  Multiple Vitamin (MULTIVITAMIN) tablet, Take 1 tablet by mouth daily., Disp: , Rfl:  .  naproxen sodium (ANAPROX) 220 MG tablet, Take 220 mg by mouth. Take 2 pills in am and 2 pills in pm, Disp: , Rfl:  .  omeprazole (PRILOSEC) 40 MG capsule, Take 40 mg by mouth 2 (two) times daily., Disp: , Rfl:  .  Potassium 99 MG TABS, Take 99 mg by mouth every evening., Disp: , Rfl:    Vital signs in last 24 hrs: Vitals:   10/13/17 0934  BP: (!) 180/80  Pulse: 74  SpO2: 94%    Physical Exam    HEENT: sclera  anicteric, oral mucosa moist without lesions  Neck: supple, no thyromegaly, JVD or lymphadenopathy  Cardiac: RRR without murmurs, S1S2 heard, no peripheral edema  Pulm: clear to auscultation bilaterally, normal RR and effort noted  Abdomen: soft, no tenderness, with active bowel sounds. No guarding or palpable hepatosplenomegaly.  Skin; warm and dry, no jaundice or rash   @ASSESSMENTPLANBEGIN @ Assessment: Encounter Diagnoses  Name Primary?  . Barrett's esophagus without dysplasia Yes  . Hiatal hernia without gangrene and obstruction    He is due for Barrett's surveillance. He has had a long period without dysplasia, so I think this could be his last upper endoscopy if no dysplasia is found on this occasion. He is happy to hear that.  Also, if no esophagitis on this exam, I would like him to decrease PPI to once daily.  Total time 15 minutes face-to-face time, over half spent in counseling and coordination of care.   [name]

## 2017-10-13 NOTE — Patient Instructions (Signed)
If you are age 81 or older, your body mass index should be between 23-30. Your Body mass index is 32.61 kg/m. If this is out of the aforementioned range listed, please consider follow up with your Primary Care Provider.  If you are age 44 or younger, your body mass index should be between 19-25. Your Body mass index is 32.61 kg/m. If this is out of the aformentioned range listed, please consider follow up with your Primary Care Provider.   You have been scheduled for an endoscopy. Please follow written instructions given to you at your visit today. If you use inhalers (even only as needed), please bring them with you on the day of your procedure. Your physician has requested that you go to www.startemmi.com and enter the access code given to you at your visit today. This web site gives a general overview about your procedure. However, you should still follow specific instructions given to you by our office regarding your preparation for the procedure.  Thank you for choosing [facility]  Dr Wilfrid Lund III

## 2017-10-13 NOTE — H&P (View-Only) (Signed)
     [facility] Progress Note  Chief Complaint: Barrett's esophagus  Subjective  History:  This is an 83 year old man I last saw in December 2017 for history of Barrett's esophagus. Dr. Sharlett Iles had been following him for many years. [name] was to have an upper endoscopy with me this past spring, but he decided to postpone it for hip replacement. He then had a prolonged recovery with a wound infection. Presently, he denies heartburn as long as he takes [name], and has been doing so twice daily for years. It is difficult to tell if he has dysphagia, as he reports that some foods feel "a little rough" in the neck when he swallows. He denies chronic abdominal pain or altered bowel habits or rectal bleeding.  ROS: Cardiovascular:  no chest pain Respiratory: no dyspnea  The patient's Past Medical, Family and Social History were reviewed and are on file in the EMR.  Objective:  Med list reviewed  Current Outpatient Medications:  .  aspirin EC 81 MG tablet, Take 1 tablet (81 mg total) by mouth daily., Disp: 30 tablet, Rfl: 0 .  atorvastatin (LIPITOR) 40 MG tablet, Take 40 mg by mouth daily at 6 PM. , Disp: , Rfl:  .  losartan (COZAAR) 50 MG tablet, Take 1 tablet (50 mg total) by mouth daily. (Patient taking differently: Take 50 mg by mouth daily. Take 2 pills daily), Disp: 30 tablet, Rfl: 0 .  meloxicam (MOBIC) 7.5 MG tablet, Take 7.5 mg by mouth daily., Disp: , Rfl:  .  Multiple Vitamin (MULTIVITAMIN) tablet, Take 1 tablet by mouth daily., Disp: , Rfl:  .  naproxen sodium (ANAPROX) 220 MG tablet, Take 220 mg by mouth. Take 2 pills in am and 2 pills in pm, Disp: , Rfl:  .  omeprazole (PRILOSEC) 40 MG capsule, Take 40 mg by mouth 2 (two) times daily., Disp: , Rfl:  .  Potassium 99 MG TABS, Take 99 mg by mouth every evening., Disp: , Rfl:    Vital signs in last 24 hrs: Vitals:   10/13/17 0934  BP: (!) 180/80  Pulse: 74  SpO2: 94%    Physical Exam    HEENT: sclera  anicteric, oral mucosa moist without lesions  Neck: supple, no thyromegaly, JVD or lymphadenopathy  Cardiac: RRR without murmurs, S1S2 heard, no peripheral edema  Pulm: clear to auscultation bilaterally, normal RR and effort noted  Abdomen: soft, no tenderness, with active bowel sounds. No guarding or palpable hepatosplenomegaly.  Skin; warm and dry, no jaundice or rash   @ASSESSMENTPLANBEGIN @ Assessment: Encounter Diagnoses  Name Primary?  . Barrett's esophagus without dysplasia Yes  . Hiatal hernia without gangrene and obstruction    He is due for Barrett's surveillance. He has had a long period without dysplasia, so I think this could be his last upper endoscopy if no dysplasia is found on this occasion. He is happy to hear that.  Also, if no esophagitis on this exam, I would like him to decrease PPI to once daily.  Total time 15 minutes face-to-face time, over half spent in counseling and coordination of care.   [name]

## 2017-10-27 ENCOUNTER — Other Ambulatory Visit: Payer: Self-pay

## 2017-10-27 ENCOUNTER — Encounter (HOSPITAL_COMMUNITY): Payer: Self-pay | Admitting: Emergency Medicine

## 2017-11-09 ENCOUNTER — Encounter (HOSPITAL_COMMUNITY)
Admission: RE | Disposition: A | Discharge status: Home / Self Care | Payer: Self-pay | Source: Ambulatory Visit | Attending: Gastroenterology

## 2017-11-09 ENCOUNTER — Encounter (HOSPITAL_COMMUNITY): Payer: Self-pay | Admitting: *Deleted

## 2017-11-09 ENCOUNTER — Ambulatory Visit (HOSPITAL_COMMUNITY): Payer: Medicare HMO | Admitting: Certified Registered"

## 2017-11-09 ENCOUNTER — Ambulatory Visit (HOSPITAL_COMMUNITY)
Admission: RE | Admit: 2017-11-09 | Discharge: 2017-11-09 | Disposition: A | Discharge status: Home / Self Care | Payer: Medicare HMO | Source: Ambulatory Visit | Attending: Gastroenterology | Admitting: Gastroenterology

## 2017-11-09 ENCOUNTER — Other Ambulatory Visit: Payer: Self-pay

## 2017-11-09 DIAGNOSIS — M1990 Unspecified osteoarthritis, unspecified site: Secondary | ICD-10-CM | POA: Diagnosis not present

## 2017-11-09 DIAGNOSIS — I509 Heart failure, unspecified: Secondary | ICD-10-CM | POA: Insufficient documentation

## 2017-11-09 DIAGNOSIS — K2950 Unspecified chronic gastritis without bleeding: Secondary | ICD-10-CM | POA: Diagnosis not present

## 2017-11-09 DIAGNOSIS — Z87891 Personal history of nicotine dependence: Secondary | ICD-10-CM | POA: Diagnosis not present

## 2017-11-09 DIAGNOSIS — I110 Hypertensive heart disease with heart failure: Secondary | ICD-10-CM | POA: Insufficient documentation

## 2017-11-09 DIAGNOSIS — Z7982 Long term (current) use of aspirin: Secondary | ICD-10-CM | POA: Diagnosis not present

## 2017-11-09 DIAGNOSIS — K2270 Barrett's esophagus without dysplasia: Secondary | ICD-10-CM | POA: Diagnosis not present

## 2017-11-09 DIAGNOSIS — K3189 Other diseases of stomach and duodenum: Secondary | ICD-10-CM | POA: Diagnosis not present

## 2017-11-09 DIAGNOSIS — K219 Gastro-esophageal reflux disease without esophagitis: Secondary | ICD-10-CM | POA: Insufficient documentation

## 2017-11-09 DIAGNOSIS — K449 Diaphragmatic hernia without obstruction or gangrene: Secondary | ICD-10-CM | POA: Insufficient documentation

## 2017-11-09 DIAGNOSIS — Z96649 Presence of unspecified artificial hip joint: Secondary | ICD-10-CM | POA: Diagnosis not present

## 2017-11-09 HISTORY — PX: ESOPHAGOGASTRODUODENOSCOPY (EGD) WITH PROPOFOL: SHX5813

## 2017-11-09 SURGERY — ESOPHAGOGASTRODUODENOSCOPY (EGD) WITH PROPOFOL
Anesthesia: Monitor Anesthesia Care

## 2017-11-09 MED ORDER — PROPOFOL 10 MG/ML IV BOLUS
INTRAVENOUS | Status: DC | PRN
  Administered 2017-11-09 (×2): 50 mg via INTRAVENOUS

## 2017-11-09 MED ORDER — LIDOCAINE 2% (20 MG/ML) 5 ML SYRINGE
INTRAMUSCULAR | Status: DC | PRN
  Administered 2017-11-09: 40 mg via INTRAVENOUS

## 2017-11-09 MED ORDER — SODIUM CHLORIDE 0.9 % IV SOLN: INTRAVENOUS | Status: DC

## 2017-11-09 MED ORDER — LACTATED RINGERS IV SOLN
INTRAVENOUS | Status: DC
  Administered 2017-11-09: 09:00:00 via INTRAVENOUS
  Administered 2017-11-09: 1000 mL via INTRAVENOUS

## 2017-11-09 MED ORDER — PROPOFOL 10 MG/ML IV BOLUS
INTRAVENOUS | Status: AC
  Filled 2017-11-09: qty 40

## 2017-11-09 SURGICAL SUPPLY — 15 items

## 2017-11-09 NOTE — Interval H&P Note (Signed)
History and Physical Interval Note:  11/09/2017 8:38 AM  [name].  has presented today for surgery, with the diagnosis of barretts esophagus  The various methods of treatment have been discussed with the patient and family. After consideration of risks, benefits and other options for treatment, the patient has consented to  Procedure(s): ESOPHAGOGASTRODUODENOSCOPY (EGD) WITH PROPOFOL (N/A) as a surgical intervention .  The patient's history has been reviewed, patient examined, no change in status, stable for surgery.  I have reviewed the patient's chart and labs.  Questions were answered to the patient's satisfaction.     [name]

## 2017-11-09 NOTE — Anesthesia Preprocedure Evaluation (Addendum)
Anesthesia Evaluation  Patient identified by MRN, date of birth, ID band Patient awake    Reviewed: Allergy & Precautions, NPO status , Patient's Chart, lab work & pertinent test results  Airway Mallampati: II  TM Distance: >3 FB Neck ROM: Full    Dental no notable dental hx. (+) Upper Dentures, Dental Advisory Given   Pulmonary neg pulmonary ROS, former smoker,    Pulmonary exam normal breath sounds clear to auscultation       Cardiovascular hypertension, +CHF  Normal cardiovascular exam Rhythm:Regular Rate:Normal  1. No significant coronary artery disease, circumflex has mild disease. LAD has minimal disease. Right coronary artery is dominant. 2. Low normal LVEF at 45-50%.   Neuro/Psych negative neurological ROS  negative psych ROS   GI/Hepatic Neg liver ROS, GERD  ,  Endo/Other  negative endocrine ROS  Renal/GU negative Renal ROS  negative genitourinary   Musculoskeletal  (+) Arthritis , Ankylosing spondylitis   Abdominal   Peds negative pediatric ROS (+)  Hematology negative hematology ROS (+)   Anesthesia Other Findings   Reproductive/Obstetrics negative OB ROS                            Anesthesia Physical  Anesthesia Plan  ASA: III  Anesthesia Plan: MAC   Post-op Pain Management:    Induction:   PONV Risk Score and Plan: 1 and Ondansetron  Airway Management Planned: Nasal Cannula  Additional Equipment:   Intra-op Plan:   Post-operative Plan:   Informed Consent: I have reviewed the patients History and Physical, chart, labs and discussed the procedure including the risks, benefits and alternatives for the proposed anesthesia with the patient or authorized representative who has indicated his/her understanding and acceptance.   Dental advisory given  Plan Discussed with: CRNA and Surgeon  Anesthesia Plan Comments:         Anesthesia Quick Evaluation

## 2017-11-09 NOTE — Transfer of Care (Signed)
Immediate Anesthesia Transfer of Care Note  Patient: [name].  Procedure(s) Performed: ESOPHAGOGASTRODUODENOSCOPY (EGD) WITH PROPOFOL (N/A )  Patient Location: PACU and Endoscopy Unit  Anesthesia Type:MAC  Level of Consciousness: awake and alert   Airway & Oxygen Therapy: Patient Spontanous Breathing and Patient connected to nasal cannula oxygen  Post-op Assessment: Report given to RN and Post -op Vital signs reviewed and stable  Post vital signs: Reviewed and stable  Last Vitals:  Vitals:   11/09/17 0725  BP: (!) 186/82  Pulse: 77  Resp: 10  Temp: 36.7 C  SpO2: 95%    Last Pain:  Vitals:   11/09/17 0725  TempSrc: Oral         Complications: No apparent anesthesia complications

## 2017-11-09 NOTE — Op Note (Signed)
[facility] Patient Name: [name] Procedure Date: 11/09/2017 MRN: [id number] Attending MD: Estill Cotta. Loletha Carrow , MD Date of Birth: 02-May-1934 CSN: [id number] Age: 83 Admit Type: Outpatient Procedure:                Upper GI endoscopy Indications:              Surveillance for malignancy due to personal history                            of Barrett's esophagus Providers:                Mallie Mussel L. Loletha Carrow, MD, Elmer Ramp. Tilden Dome, RN, Danford Bad, Technician, Glenis Smoker, CRNA Referring MD:              Medicines:                Monitored Anesthesia Care Complications:            No immediate complications. Estimated Blood Loss:     Estimated blood loss was minimal. Procedure:                Pre-Anesthesia Assessment:                           - Prior to the procedure, a History and Physical                            was performed, and patient medications and                            allergies were reviewed. The patient's tolerance of                            previous anesthesia was also reviewed. The risks                            and benefits of the procedure and the sedation                            options and risks were discussed with the patient.                            All questions were answered, and informed consent                            was obtained. Prior Anticoagulants: The patient has                            taken no previous anticoagulant or antiplatelet                            agents. ASA Grade Assessment: III - A patient with  severe systemic disease. After reviewing the risks                            and benefits, the patient was deemed in                            satisfactory condition to undergo the procedure.                           After obtaining informed consent, the endoscope was                            passed under direct vision. Throughout the       procedure, the patient's blood pressure, pulse, and                            oxygen saturations were monitored continuously. The                            EG-[id number] ([id number]) scope was introduced through the                            mouth, and advanced to the second part of duodenum.                            The upper GI endoscopy was accomplished without                            difficulty. The patient tolerated the procedure. Scope In: Scope Out: Findings:      A medium-sized hiatal hernia was present.      There were esophageal mucosal changes suspicious for short-segment       Barrett's esophagus present at the gastroesophageal junction (single       tongue salmon-colored mucosa above a squamous island). The maximum       longitudinal extent of these mucosal changes was 1 cm in length. There       were no raised or otherwise suspicious areas. Mucosa was biopsied with a       cold forceps for histology. One specimen bottle was sent to pathology.      The stomach was normal.      The cardia and gastric fundus were normal on retroflexion.      The examined duodenum was normal. Impression:               - Medium-sized hiatal hernia.                           - Esophageal mucosal changes suspicious for                            short-segment Barrett's esophagus. Biopsied.                           - Normal stomach.                           -  Normal examined duodenum. Moderate Sedation:      MAC sedation used Recommendation:           - Patient has a contact number available for                            emergencies. The signs and symptoms of potential                            delayed complications were discussed with the                            patient. Return to normal activities tomorrow.                            Written discharge instructions were provided to the                            patient.                           - Resume previous diet.                            - Continue present medications.                           - Await pathology results.                           - No repeat surveillance upper endoscopy due to age                            and stability of Barrett's. Procedure Code(s):        --- Professional ---                           [phone], Esophagogastroduodenoscopy, flexible,                            transoral; with biopsy, single or multiple Diagnosis Code(s):        --- Professional ---                           K44.9, Diaphragmatic hernia without obstruction or                            gangrene                           K22.70, Barrett's esophagus without dysplasia CPT copyright 2016 [employer]. All rights reserved. The codes documented in this report are preliminary and upon coder review may  be revised to meet current compliance requirements. Cezar Misiaszek L. Loletha Carrow, MD 11/09/2017 8:55:14 AM This report has been signed electronically. Number of Addenda: 0

## 2017-11-09 NOTE — Discharge Instructions (Signed)
YOU HAD AN ENDOSCOPIC PROCEDURE TODAY: Refer to the procedure report and other information in the discharge instructions given to you for any specific questions about what was found during the examination. If this information does not answer your questions, please call [place] office at [phone] to clarify.  ° °YOU SHOULD EXPECT: Some feelings of bloating in the abdomen. Passage of more gas than usual. Walking can help get rid of the air that was put into your GI tract during the procedure and reduce the bloating. If you had a lower endoscopy (such as a colonoscopy or flexible sigmoidoscopy) you may notice spotting of blood in your stool or on the toilet paper. Some abdominal soreness may be present for a day or two, also. ° °DIET: Your first meal following the procedure should be a light meal and then it is ok to progress to your normal diet. A half-sandwich or bowl of soup is an example of a good first meal. Heavy or fried foods are harder to digest and may make you feel nauseous or bloated. Drink plenty of fluids but you should avoid alcoholic beverages for 24 hours. If you had a esophageal dilation, please see attached instructions for diet.   ° °ACTIVITY: Your care partner should take you home directly after the procedure. You should plan to take it easy, moving slowly for the rest of the day. You can resume normal activity the day after the procedure however YOU SHOULD NOT DRIVE, use power tools, machinery or perform tasks that involve climbing or major physical exertion for 24 hours (because of the sedation medicines used during the test).  ° °SYMPTOMS TO REPORT IMMEDIATELY: °A gastroenterologist can be reached at any hour. Please call [phone]  for any of the following symptoms:  °Following lower endoscopy (colonoscopy, flexible sigmoidoscopy) °Excessive amounts of blood in the stool  °Significant tenderness, worsening of abdominal pains  °Swelling of the abdomen that is new, acute  °Fever of 100° or  higher  °Following upper endoscopy (EGD, EUS, ERCP, esophageal dilation) °Vomiting of blood or coffee ground material  °New, significant abdominal pain  °New, significant chest pain or pain under the shoulder blades  °Painful or persistently difficult swallowing  °New shortness of breath  °Black, tarry-looking or red, bloody stools ° °FOLLOW UP:  °If any biopsies were taken you will be contacted by phone or by letter within the next 1-3 weeks. Call [phone]  if you have not heard about the biopsies in 3 weeks.  °Please also call with any specific questions about appointments or follow up tests. ° °

## 2017-11-09 NOTE — Anesthesia Procedure Notes (Signed)
Date/Time: 11/09/2017 8:35 AM Performed by: Cynda Familia, CRNA Pre-anesthesia Checklist: Patient identified, Emergency Drugs available, Suction available, Patient being monitored and Timeout performed Oxygen Delivery Method: Simple face mask Placement Confirmation: positive ETCO2 and breath sounds checked- equal and bilateral Dental Injury: Teeth and Oropharynx as per pre-operative assessment

## 2017-11-09 NOTE — Anesthesia Postprocedure Evaluation (Signed)
Anesthesia Post Note  Patient: [name].  Procedure(s) Performed: ESOPHAGOGASTRODUODENOSCOPY (EGD) WITH PROPOFOL (N/A )     Patient location during evaluation: PACU Anesthesia Type: MAC Level of consciousness: awake and alert Pain management: pain level controlled Vital Signs Assessment: post-procedure vital signs reviewed and stable Respiratory status: spontaneous breathing, nonlabored ventilation, respiratory function stable and patient connected to nasal cannula oxygen Cardiovascular status: stable and blood pressure returned to baseline Postop Assessment: no apparent nausea or vomiting Anesthetic complications: no    Last Vitals:  Vitals:   11/09/17 0910 11/09/17 0920  BP: (!) 132/110 (!) 163/74  Pulse: 73 73  Resp: (!) 21 15  Temp:    SpO2: 95% 96%    Last Pain:  Vitals:   11/09/17 0857  TempSrc: Oral                 Effie Berkshire

## 2017-11-10 ENCOUNTER — Encounter (HOSPITAL_COMMUNITY): Payer: Self-pay | Admitting: Gastroenterology

## 2017-11-11 ENCOUNTER — Telehealth: Payer: Self-pay | Admitting: Gastroenterology

## 2017-11-11 NOTE — Telephone Encounter (Signed)
-----   Message from Doristine Counter, RN sent at 11/11/2017  2:52 PM EST ----- Patient advised of results and recommendations. He wanted to let you know that today his bowel movement was black, yesterday normal color. Denies any shortness of breath or dizziness, no abdominal pain.

## 2017-11-11 NOTE — Telephone Encounter (Signed)
Thanks for letting me know. Sounds like some blood in the stool from biopsies.

## 2017-11-11 NOTE — Telephone Encounter (Signed)
See result note.  

## 2017-11-12 NOTE — Telephone Encounter (Signed)
Let patient know most likely related to biopsy site, if sxs do not improve or worsen he needs to contact our office or if needed go to ED.

## 2017-11-25 DIAGNOSIS — B354 Tinea corporis: Secondary | ICD-10-CM | POA: Diagnosis not present

## 2017-11-25 DIAGNOSIS — N492 Inflammatory disorders of scrotum: Secondary | ICD-10-CM | POA: Diagnosis not present

## 2017-11-25 DIAGNOSIS — I10 Essential (primary) hypertension: Secondary | ICD-10-CM | POA: Diagnosis not present

## 2018-03-16 DIAGNOSIS — K219 Gastro-esophageal reflux disease without esophagitis: Secondary | ICD-10-CM | POA: Diagnosis not present

## 2018-03-16 DIAGNOSIS — I10 Essential (primary) hypertension: Secondary | ICD-10-CM | POA: Diagnosis not present

## 2018-03-16 DIAGNOSIS — N401 Enlarged prostate with lower urinary tract symptoms: Secondary | ICD-10-CM | POA: Diagnosis not present

## 2018-03-16 DIAGNOSIS — E7800 Pure hypercholesterolemia, unspecified: Secondary | ICD-10-CM | POA: Diagnosis not present

## 2018-03-16 DIAGNOSIS — M489 Spondylopathy, unspecified: Secondary | ICD-10-CM | POA: Diagnosis not present

## 2018-03-16 DIAGNOSIS — I714 Abdominal aortic aneurysm, without rupture: Secondary | ICD-10-CM | POA: Diagnosis not present

## 2018-03-16 DIAGNOSIS — R7301 Impaired fasting glucose: Secondary | ICD-10-CM | POA: Diagnosis not present

## 2018-03-16 DIAGNOSIS — M459 Ankylosing spondylitis of unspecified sites in spine: Secondary | ICD-10-CM | POA: Diagnosis not present

## 2018-04-08 DIAGNOSIS — L03311 Cellulitis of abdominal wall: Secondary | ICD-10-CM | POA: Diagnosis not present

## 2018-05-13 DIAGNOSIS — Z85828 Personal history of other malignant neoplasm of skin: Secondary | ICD-10-CM | POA: Diagnosis not present

## 2018-05-13 DIAGNOSIS — L814 Other melanin hyperpigmentation: Secondary | ICD-10-CM | POA: Diagnosis not present

## 2018-05-13 DIAGNOSIS — D1801 Hemangioma of skin and subcutaneous tissue: Secondary | ICD-10-CM | POA: Diagnosis not present

## 2018-05-13 DIAGNOSIS — I788 Other diseases of capillaries: Secondary | ICD-10-CM | POA: Diagnosis not present

## 2018-05-13 DIAGNOSIS — D225 Melanocytic nevi of trunk: Secondary | ICD-10-CM | POA: Diagnosis not present

## 2018-05-13 DIAGNOSIS — L821 Other seborrheic keratosis: Secondary | ICD-10-CM | POA: Diagnosis not present

## 2018-05-13 DIAGNOSIS — L570 Actinic keratosis: Secondary | ICD-10-CM | POA: Diagnosis not present

## 2018-05-13 DIAGNOSIS — Z872 Personal history of diseases of the skin and subcutaneous tissue: Secondary | ICD-10-CM | POA: Diagnosis not present

## 2018-06-21 DIAGNOSIS — R21 Rash and other nonspecific skin eruption: Secondary | ICD-10-CM | POA: Diagnosis not present

## 2018-06-21 DIAGNOSIS — E119 Type 2 diabetes mellitus without complications: Secondary | ICD-10-CM | POA: Diagnosis not present

## 2018-06-21 DIAGNOSIS — M459 Ankylosing spondylitis of unspecified sites in spine: Secondary | ICD-10-CM | POA: Diagnosis not present

## 2018-06-21 DIAGNOSIS — E7800 Pure hypercholesterolemia, unspecified: Secondary | ICD-10-CM | POA: Diagnosis not present

## 2018-09-07 DIAGNOSIS — H01021 Squamous blepharitis right upper eyelid: Secondary | ICD-10-CM | POA: Diagnosis not present

## 2018-09-07 DIAGNOSIS — H2511 Age-related nuclear cataract, right eye: Secondary | ICD-10-CM | POA: Diagnosis not present

## 2018-09-07 DIAGNOSIS — H01025 Squamous blepharitis left lower eyelid: Secondary | ICD-10-CM | POA: Diagnosis not present

## 2018-09-07 DIAGNOSIS — H20022 Recurrent acute iridocyclitis, left eye: Secondary | ICD-10-CM | POA: Diagnosis not present

## 2018-09-07 DIAGNOSIS — H01024 Squamous blepharitis left upper eyelid: Secondary | ICD-10-CM | POA: Diagnosis not present

## 2018-09-07 DIAGNOSIS — Z961 Presence of intraocular lens: Secondary | ICD-10-CM | POA: Diagnosis not present

## 2018-09-07 DIAGNOSIS — H43812 Vitreous degeneration, left eye: Secondary | ICD-10-CM | POA: Diagnosis not present

## 2018-09-07 DIAGNOSIS — H01022 Squamous blepharitis right lower eyelid: Secondary | ICD-10-CM | POA: Diagnosis not present

## 2018-09-07 DIAGNOSIS — H43813 Vitreous degeneration, bilateral: Secondary | ICD-10-CM | POA: Diagnosis not present

## 2018-09-07 DIAGNOSIS — H43811 Vitreous degeneration, right eye: Secondary | ICD-10-CM | POA: Diagnosis not present

## 2018-10-24 DIAGNOSIS — Z79899 Other long term (current) drug therapy: Secondary | ICD-10-CM | POA: Diagnosis not present

## 2018-10-24 DIAGNOSIS — M1991 Primary osteoarthritis, unspecified site: Secondary | ICD-10-CM | POA: Diagnosis not present

## 2018-10-24 DIAGNOSIS — Z791 Long term (current) use of non-steroidal anti-inflammatories (NSAID): Secondary | ICD-10-CM | POA: Diagnosis not present

## 2018-10-24 DIAGNOSIS — I10 Essential (primary) hypertension: Secondary | ICD-10-CM | POA: Diagnosis not present

## 2018-10-24 DIAGNOSIS — L539 Erythematous condition, unspecified: Secondary | ICD-10-CM | POA: Diagnosis not present

## 2018-10-24 DIAGNOSIS — R21 Rash and other nonspecific skin eruption: Secondary | ICD-10-CM | POA: Diagnosis not present

## 2018-10-24 DIAGNOSIS — Z87891 Personal history of nicotine dependence: Secondary | ICD-10-CM | POA: Diagnosis not present

## 2018-10-25 DIAGNOSIS — S41109A Unspecified open wound of unspecified upper arm, initial encounter: Secondary | ICD-10-CM | POA: Diagnosis not present

## 2018-11-01 DIAGNOSIS — Z23 Encounter for immunization: Secondary | ICD-10-CM | POA: Diagnosis not present

## 2018-11-01 DIAGNOSIS — G479 Sleep disorder, unspecified: Secondary | ICD-10-CM | POA: Diagnosis not present

## 2018-11-01 DIAGNOSIS — Z0000 Encounter for general adult medical examination without abnormal findings: Secondary | ICD-10-CM | POA: Diagnosis not present

## 2018-11-01 DIAGNOSIS — F339 Major depressive disorder, recurrent, unspecified: Secondary | ICD-10-CM | POA: Diagnosis not present

## 2018-11-01 DIAGNOSIS — I2510 Atherosclerotic heart disease of native coronary artery without angina pectoris: Secondary | ICD-10-CM | POA: Diagnosis not present

## 2018-11-01 DIAGNOSIS — F411 Generalized anxiety disorder: Secondary | ICD-10-CM | POA: Diagnosis not present

## 2018-11-01 DIAGNOSIS — Z1389 Encounter for screening for other disorder: Secondary | ICD-10-CM | POA: Diagnosis not present

## 2018-11-01 DIAGNOSIS — L989 Disorder of the skin and subcutaneous tissue, unspecified: Secondary | ICD-10-CM | POA: Diagnosis not present

## 2018-11-01 DIAGNOSIS — I714 Abdominal aortic aneurysm, without rupture: Secondary | ICD-10-CM | POA: Diagnosis not present

## 2018-11-01 DIAGNOSIS — E119 Type 2 diabetes mellitus without complications: Secondary | ICD-10-CM | POA: Diagnosis not present

## 2018-11-01 DIAGNOSIS — E7800 Pure hypercholesterolemia, unspecified: Secondary | ICD-10-CM | POA: Diagnosis not present

## 2018-11-01 DIAGNOSIS — I10 Essential (primary) hypertension: Secondary | ICD-10-CM | POA: Diagnosis not present

## 2018-11-15 DIAGNOSIS — R7989 Other specified abnormal findings of blood chemistry: Secondary | ICD-10-CM | POA: Diagnosis not present

## 2018-11-15 DIAGNOSIS — R809 Proteinuria, unspecified: Secondary | ICD-10-CM | POA: Diagnosis not present

## 2018-11-28 IMAGING — CT CT HEAD W/O CM
3 of 4 series · 15 of 47 positions shown, 18 images · non-contrast
Comparison: None.

CLINICAL DATA: 83 y/o  M; altered mental status.

EXAM:
CT HEAD WITHOUT CONTRAST
TECHNIQUE: Contiguous axial images were obtained from the base of the skull
through the vertex without intravenous contrast.

[Series 2: head w/o · axial · non-contrast · 0.45mm/px · z∈[+1604,+1724]mm · 9 of 30 slices shown, 12 images]
[im 3/30  brain]
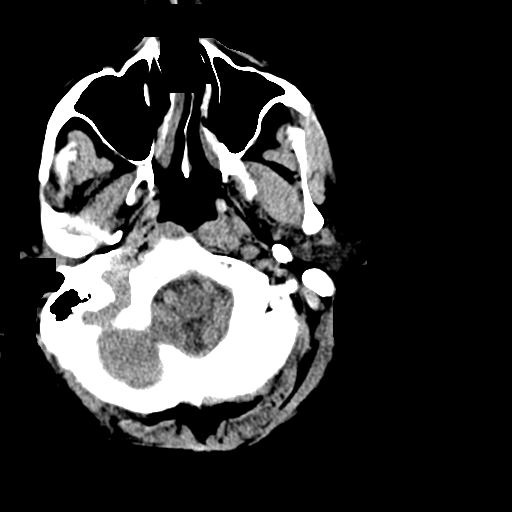
[im 3/30  bone]
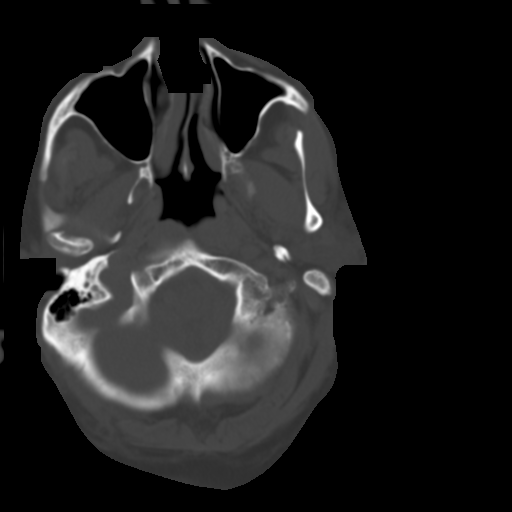
[im 7/30  brain]
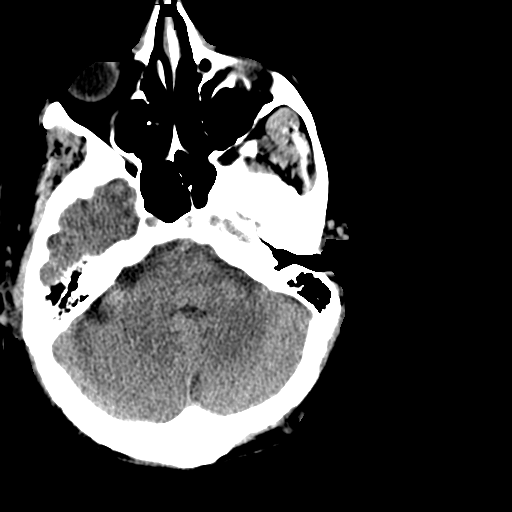
[im 9/30  brain]
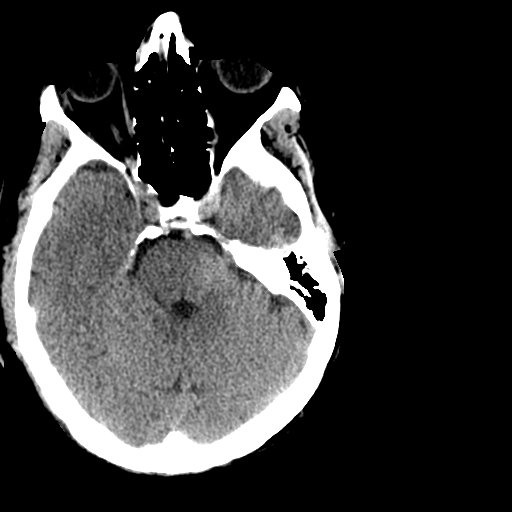
[im 13/30  brain]
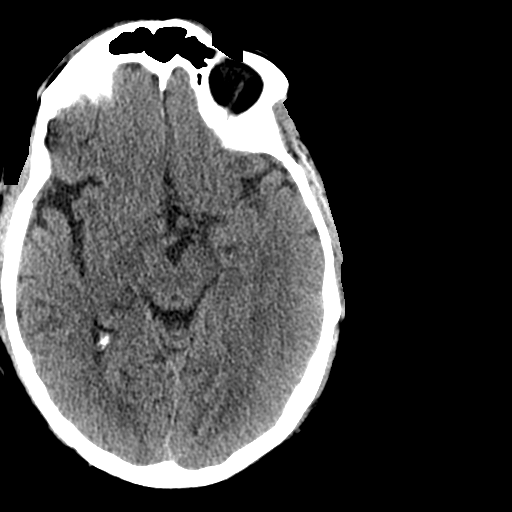
[im 15/30  brain]
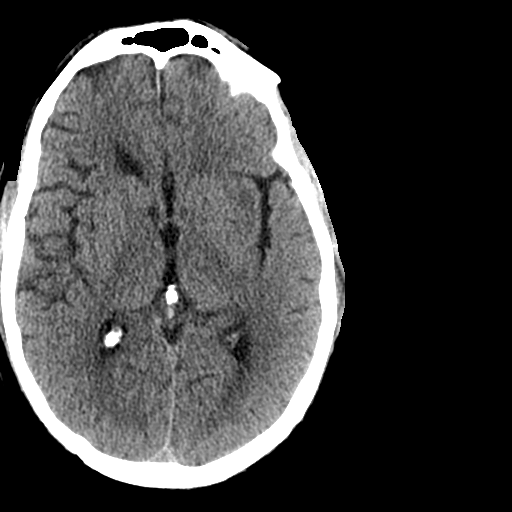
[im 15/30  bone]
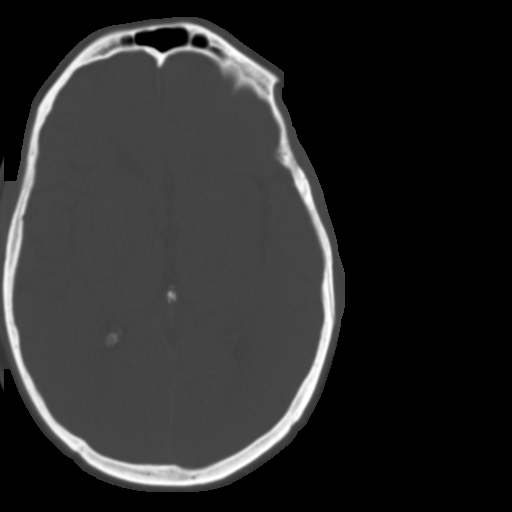
[im 17/30  brain]
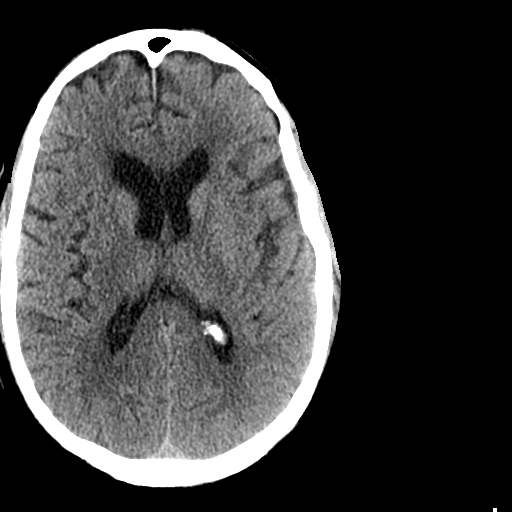
[im 21/30  brain]
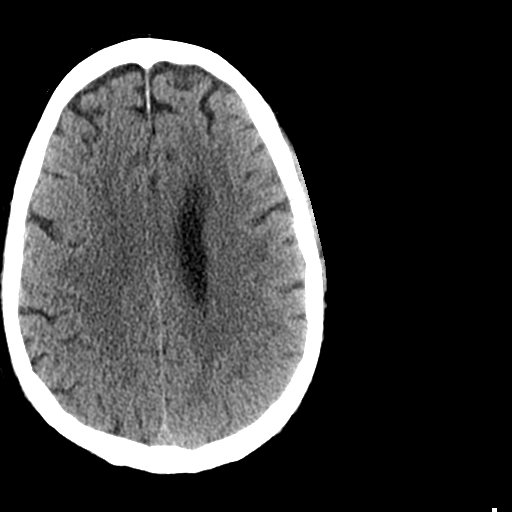
[im 23/30  brain]
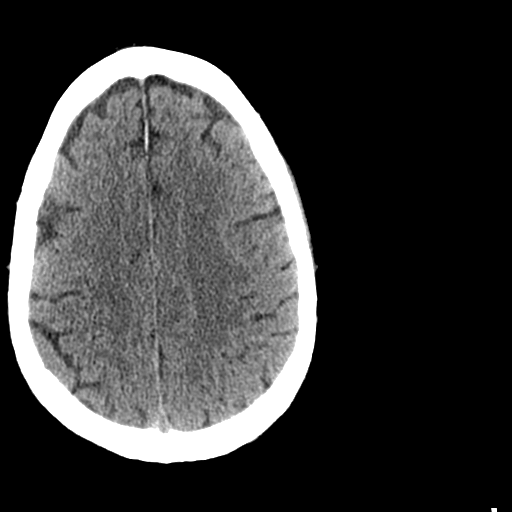
[im 27/30  brain]
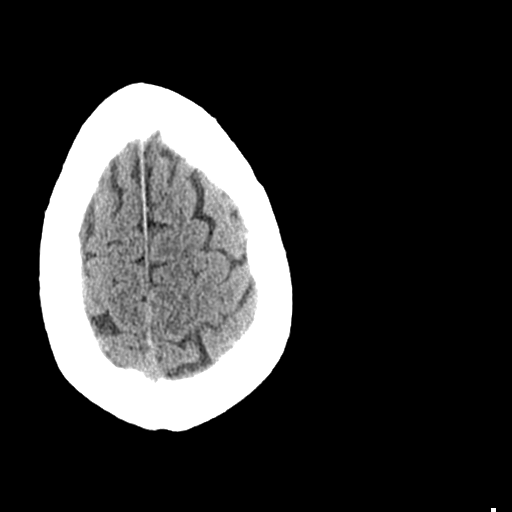
[im 27/30  bone]
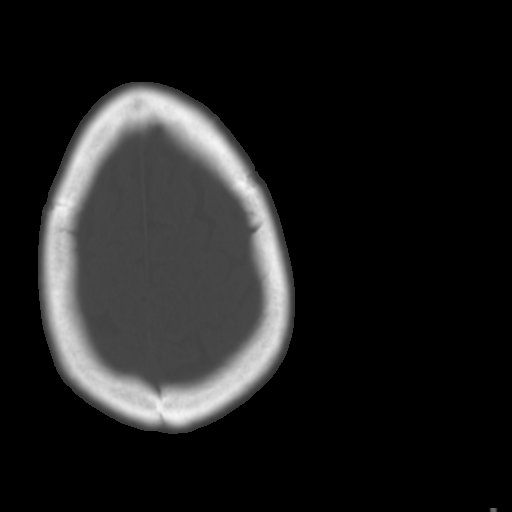

[Series 4: coronal · coronal · 0.32mm/px · 3 of 71 slices shown]
[im 24/71  brain]
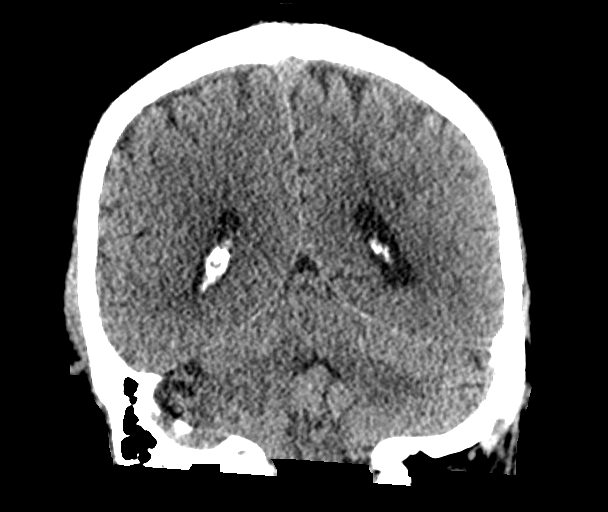
[im 32/71  brain]
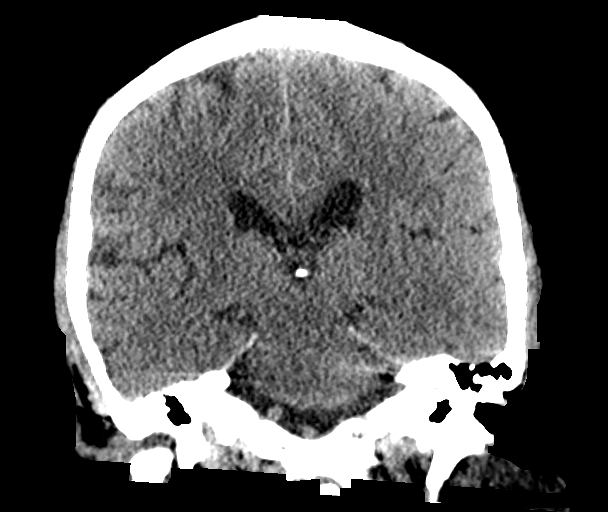
[im 39/71  brain]
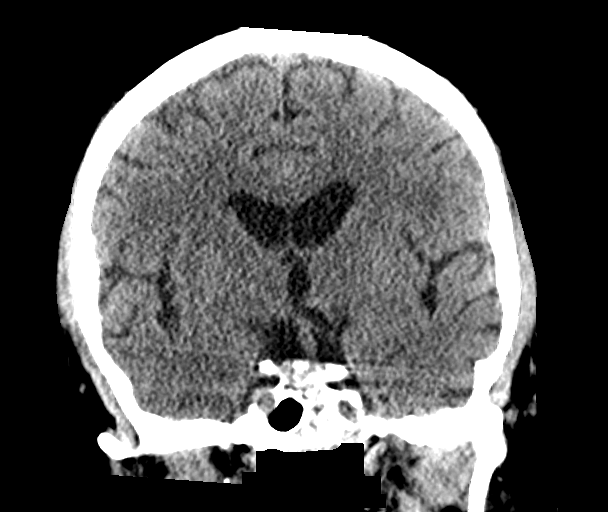

[Series 5: sagittal · sagittal · 0.33mm/px · 3 of 58 slices shown]
[im 20/58  brain]
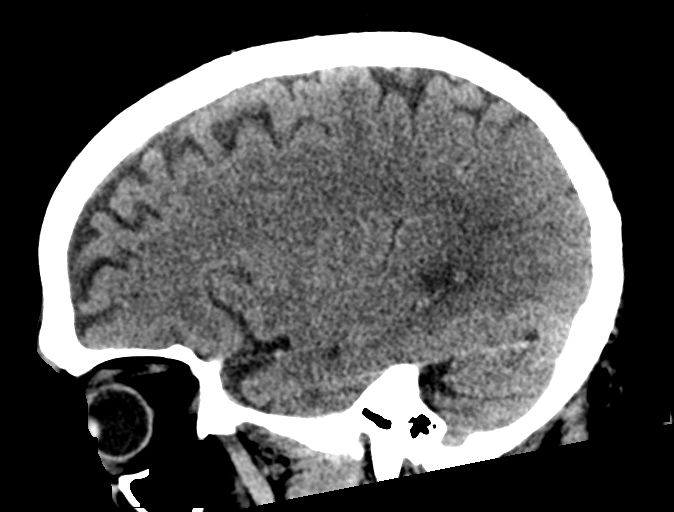
[im 29/58  brain]
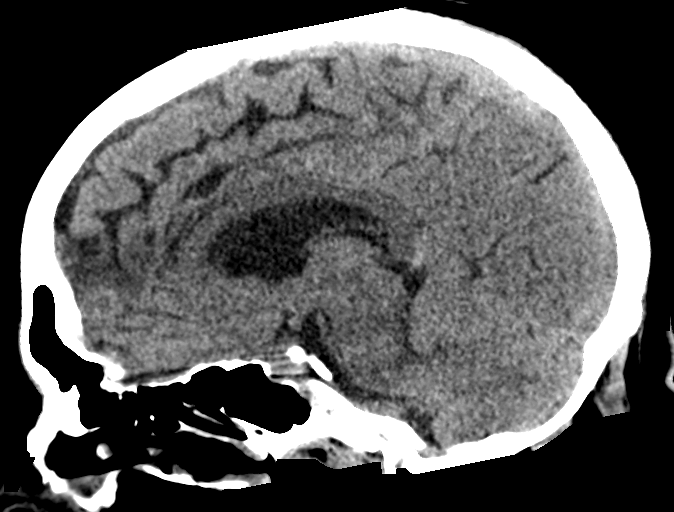
[im 39/58  brain]
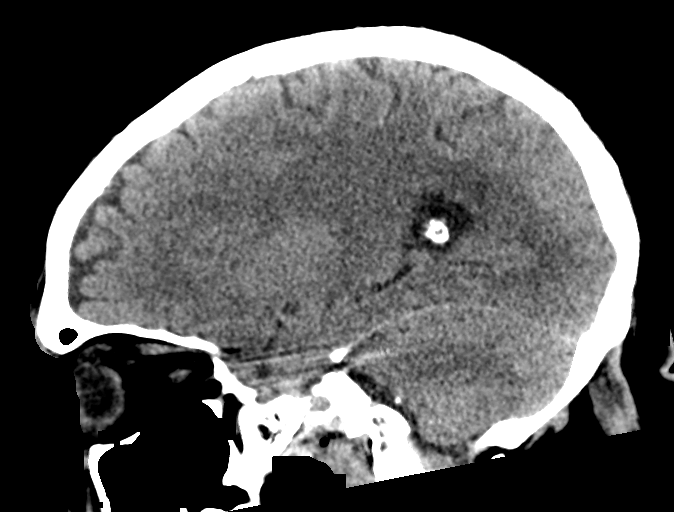

[15 of 47 positions shown; findings below may reference images not displayed]

FINDINGS: Brain: No evidence of acute infarction, hemorrhage, hydrocephalus,
extra-axial collection or mass lesion/mass effect. Few foci of
hypoattenuation in subcortical and periventricular white matter is
compatible with mild chronic microvascular ischemic changes. Mild
brain parenchymal volume loss.

Vascular: No hyperdense vessel or unexpected calcification.

Skull: Normal. Negative for fracture or focal lesion.

Sinuses/Orbits: No acute finding.

Other: None.
IMPRESSION: 1. No acute intracranial abnormality.
2. Mild chronic microvascular ischemic changes and mild parenchymal
volume loss of the brain.

By: Saufung Feng M.D.

## 2018-12-01 IMAGING — DX DG CHEST 2V
3 series · 3 of 3 positions shown · non-contrast
Comparison: Chest x-ray of 02/09/2017

CLINICAL DATA: Recent diagnosis of pneumonia, followup, smoking
history

EXAM:
CHEST  2 VIEW

[chest pa (1 of 2)]
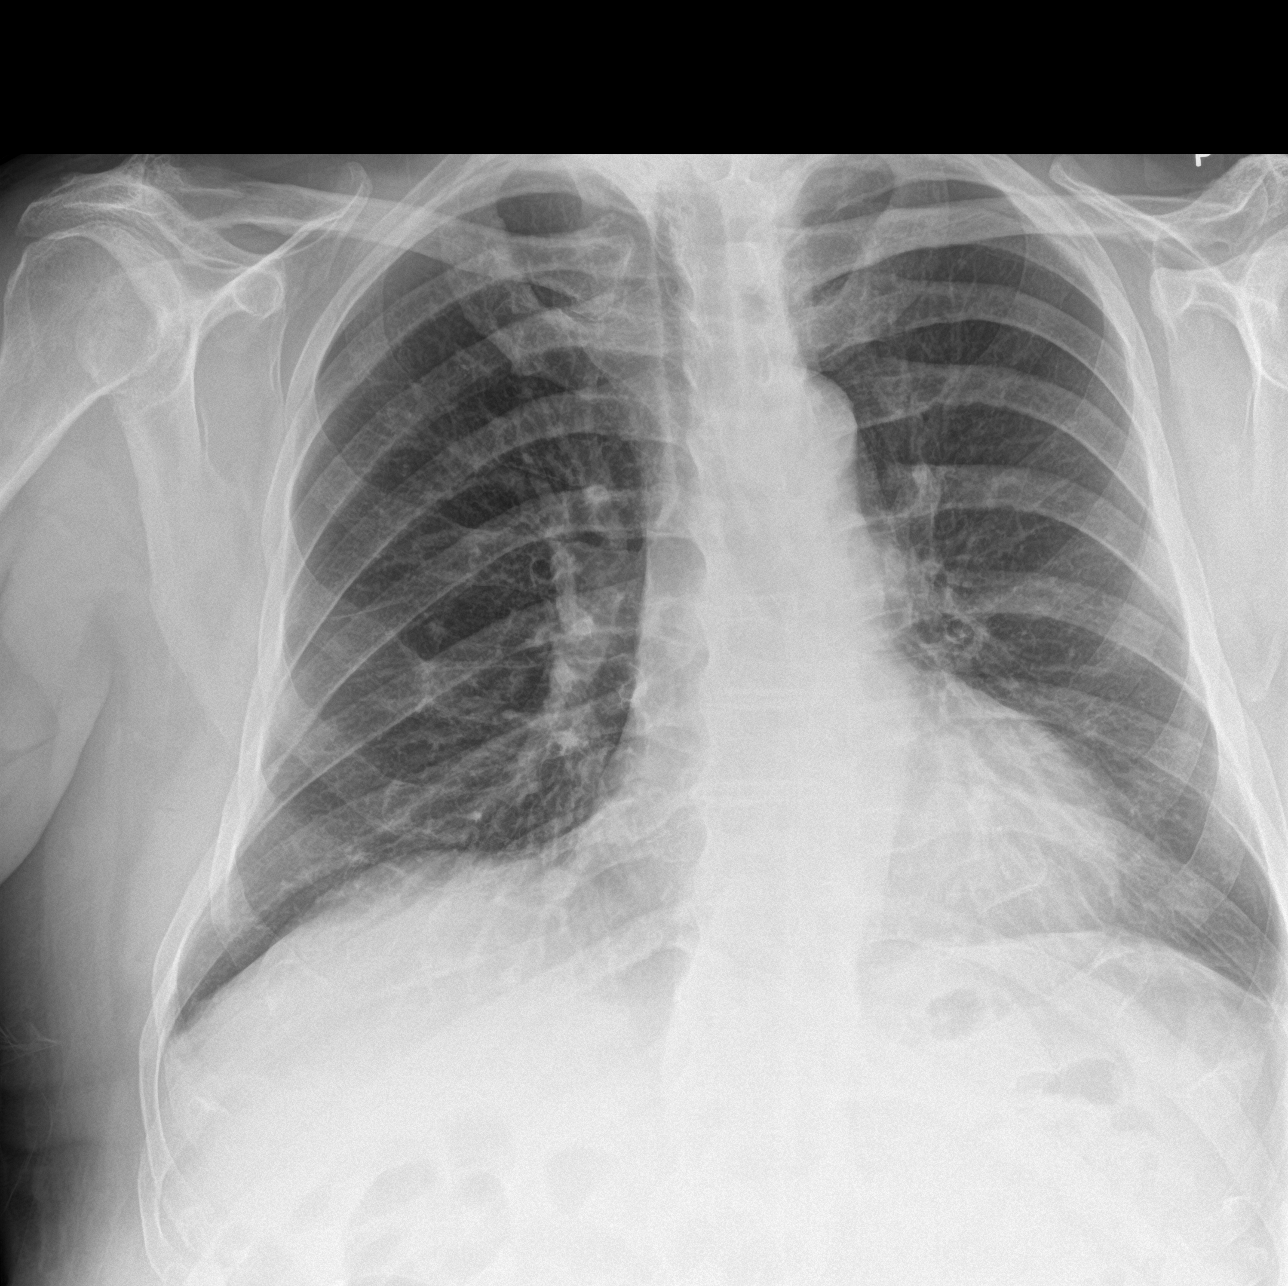

[chest lat]
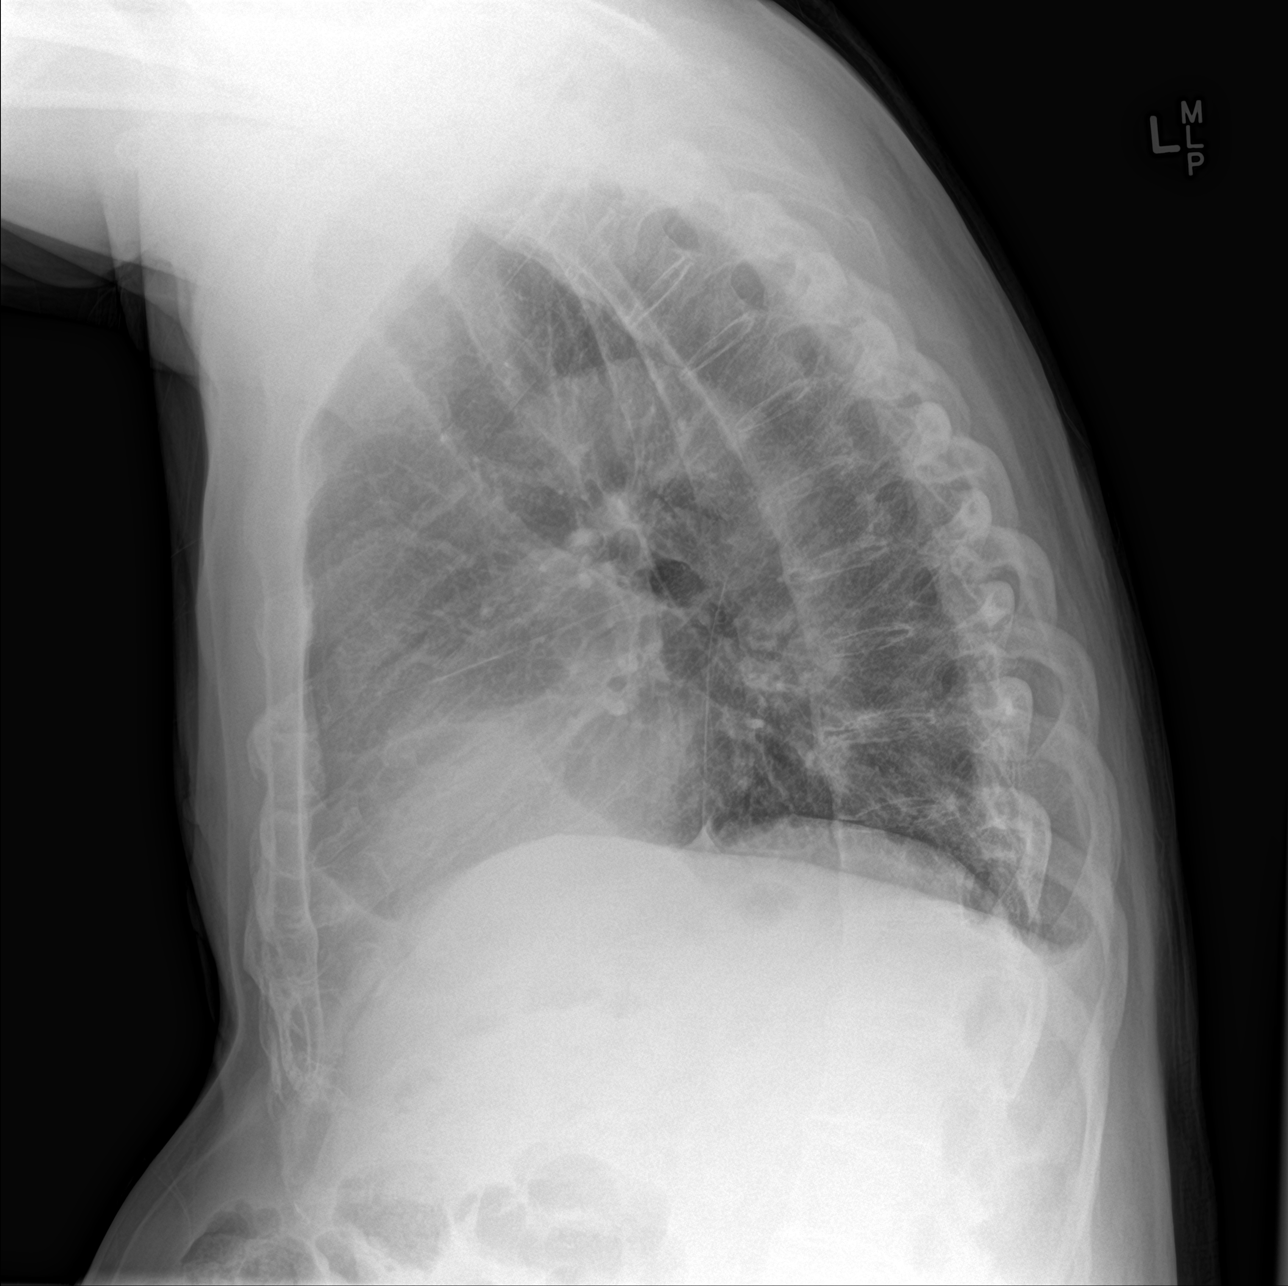

[chest pa (2 of 2)]
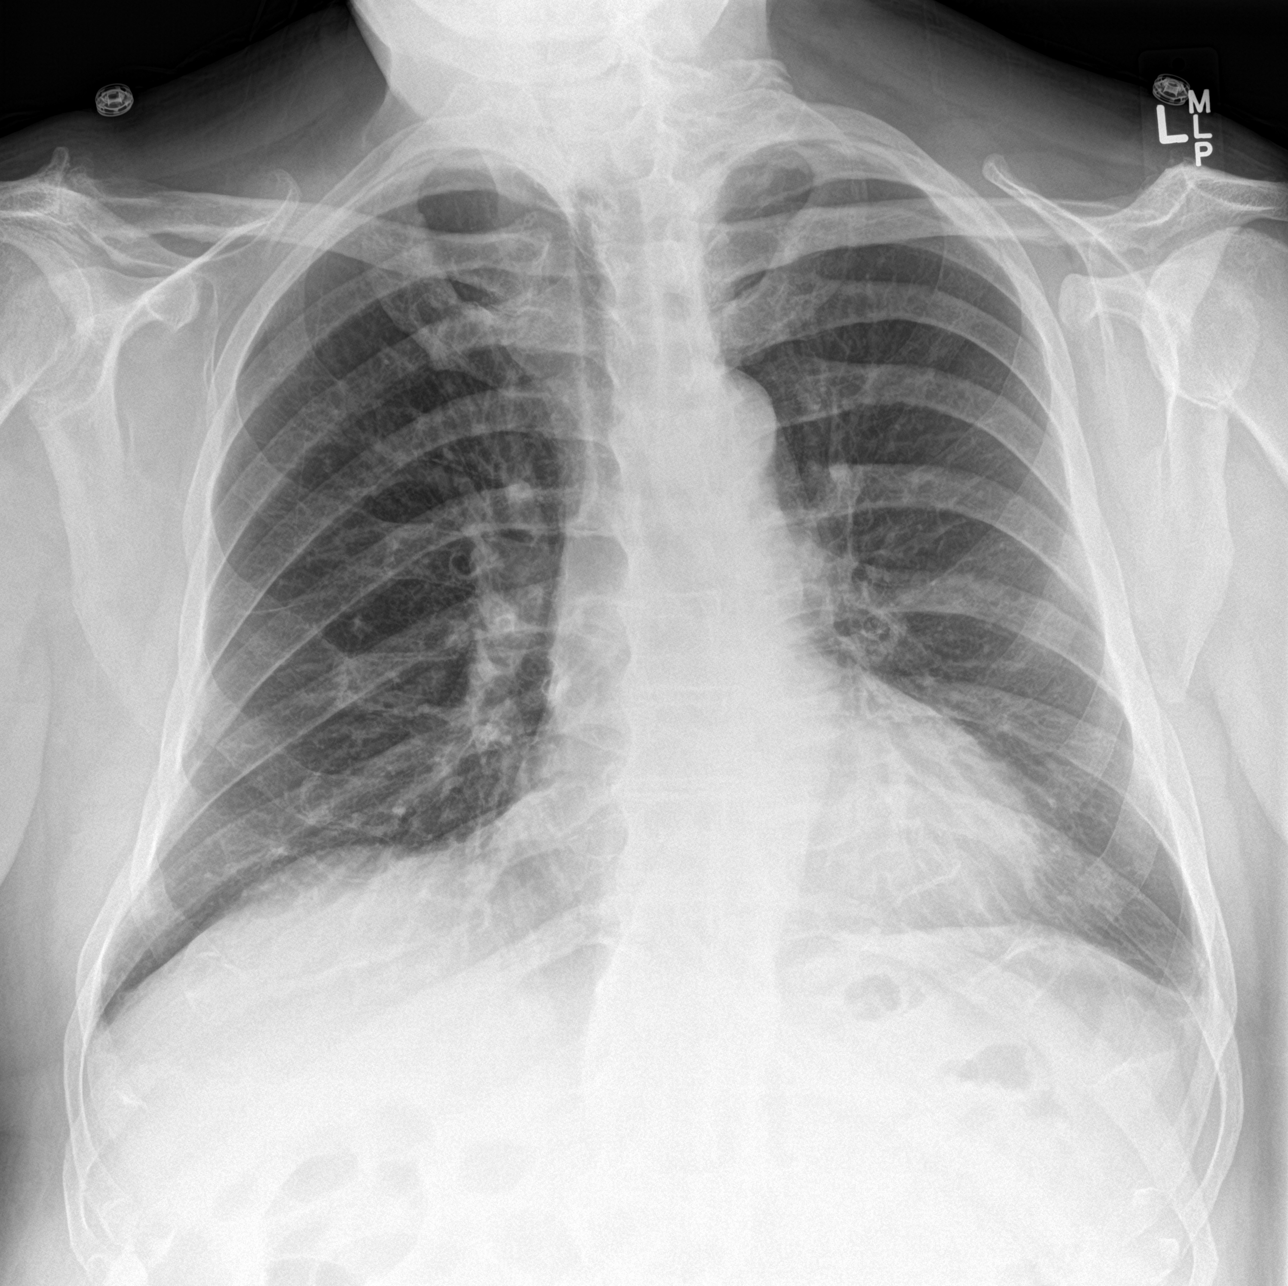

[3 of 3 positions shown; findings below may reference images not displayed]

FINDINGS: No pneumonia is seen. However the does appear to be a small effusion
probably on the left. Mediastinal and hilar contours are
unremarkable. Mild cardiomegaly is stable. There are degenerative
changes throughout the thoracic spine.
IMPRESSION: No pneumonia.  Tiny left pleural effusion remains.

## 2018-12-20 DIAGNOSIS — D649 Anemia, unspecified: Secondary | ICD-10-CM | POA: Diagnosis not present

## 2019-01-03 DIAGNOSIS — R238 Other skin changes: Secondary | ICD-10-CM | POA: Diagnosis not present

## 2019-02-28 ENCOUNTER — Other Ambulatory Visit: Payer: Self-pay | Admitting: Family Medicine

## 2019-02-28 DIAGNOSIS — I714 Abdominal aortic aneurysm, without rupture, unspecified: Secondary | ICD-10-CM

## 2019-03-17 DIAGNOSIS — L03115 Cellulitis of right lower limb: Secondary | ICD-10-CM | POA: Diagnosis not present

## 2019-03-17 DIAGNOSIS — Z8614 Personal history of Methicillin resistant Staphylococcus aureus infection: Secondary | ICD-10-CM | POA: Diagnosis not present

## 2019-03-17 DIAGNOSIS — E119 Type 2 diabetes mellitus without complications: Secondary | ICD-10-CM | POA: Diagnosis not present

## 2019-03-23 ENCOUNTER — Encounter: Payer: Self-pay | Admitting: Internal Medicine

## 2019-03-23 ENCOUNTER — Other Ambulatory Visit: Payer: Self-pay

## 2019-03-23 ENCOUNTER — Ambulatory Visit: Payer: Medicare HMO | Admitting: Internal Medicine

## 2019-03-23 DIAGNOSIS — Z22322 Carrier or suspected carrier of Methicillin resistant Staphylococcus aureus: Secondary | ICD-10-CM | POA: Diagnosis not present

## 2019-03-23 MED ORDER — MUPIROCIN 2 % EX OINT: 1.0000 "application " | TOPICAL_OINTMENT | Freq: Two times a day (BID) | CUTANEOUS | 1 refills | Status: AC

## 2019-03-23 MED ORDER — CHLORHEXIDINE GLUCONATE 4 % EX LIQD: Freq: Every day | CUTANEOUS | 1 refills | Status: AC

## 2019-03-23 NOTE — Patient Instructions (Signed)
[facility] Five day Treatment Plan for MRSA (Staph) Decolonization  Please let us know if you have questions or concerns or do not understand the information we give you.  Prepare Chose a period when you will be uninterrupted by going away or other distractions. To ensure that your skin is in good condition, follow the Routine Skin Care principles below to reduce drying and enhance healing. Do not start while you have any active boils. Routine Skin Care principles to reduce drying and enhance healing: Avoid the use of soap when bathing or showering when performing this decolonization. DO NOT routinely use antiseptic solutions. If you need to use something, chose a soap substitute (examples -QV Wash or Cetaphil).  When drying with a towel, be gentle and pat dry your skin. Avoid rubbing the skin.  Reduce the overall frequency of bathing or showering. A short shower (3 minutes) is better than a bath in terms of its effect on the skin.  Use a simple sorbelene based-cream on your skin prior to showering and immediately after drying. (Examples: Hydroderm or other Sorbelene-based preparations). Especially protect healing or dry areas of skin in this way. Don't use a barrier cream with a vaseline base or with perfumes and additives. You are more likely to be allergic to these products, and cause further damage to your skin.  Make sure that you clean and cover any skin cuts or grazes that occur. Try to avoid picking or biting fingernails and the skin around the nails Keep your fingernails clipped short and clean to reduce problems caused by scratching.  For itchy skin, try gently massaging sorbelene-based cream into itchy areas instead of scratching it. A long-acting non-sedating antihistamine drug is the next option to try. However make sure that you are not taking medication that will interact with this drug type.                                                                          To prepare yourself for your treatment, it is recommended that you complete the following steps: Remove nose, ear and other body piercing items for several days prior to the treatment and keep them out during the treatment period  Purchase a new toothbrush, disposable razor (if used), sterident for dentures (if required) and a container of alcohol hand hygiene solution (gel or rub)  Discard old toothbrushes and razors when the treatment starts. Also discard opened deodorant rollers, skin adhesive tapes, skin creams and solutions- all of these may already be contaminated with staph  Discard pumice stone(s), sponges and disposable face cloths if used   Discard all make-up brushes, creams, and implements  Discard or hot wash all fluffy toys  Wash hair brush and comb, nail files, plastic toys and cutters in the dishwasher or purchase new ones  Remove nail varnish and artificial nails  Daily routine for 5 days     *Minimize contact with members of the community during the 5 days of treatment as much as possible* Body washes The effectiveness of the program increases if the correct procedure is used: Apply the provided antiseptic body wash (2% chlorhexidine) in the shower daily  Take care to wash hair, under the arms and   into the groin and into any folds of skin  Allow the antiseptic to remain on the skin for at least 5 minutes  Nasal ointment Disinfect your hands with alcohol gel/rub and allow to dry   Open the mupirocin 2% (Bactroban) nasal ointment.  Place small amount (size of match head) of ointment onto a clean cotton swab and massage gently around the inside of the nostril on one side, making sure not to insert it too deeply (no more than 2 cm or a little less than an inch).  Use a new cotton bud for the other nostril so that you do not contaminate the Bactroban tube with staph.   After applying the ointment, press a finger against the nose next to the nostril opening and use a circular  motion to spread the ointment within the nose  Apply the mupirocin ointment two times a day for 5 days  Disinfect your hands with alcohol rub/gel after applying the ointmentp>  Personal items (combs, razor, eyeglasses, jewelry, etc.)     Disinfect all personal items daily with an alcohol-based cleanser  House Environment and Clothes/Linens On day 2 and 5 of the treatment, clean your house, (especially the bedroom and bathroom). Clean dust off all surfaces and then vacuum clean all floor surfaces AND soft furnishings (such as your favorite chair). If your chair/couch has a vinyl or leather covering then wipe over the chair with warm soapy water and then dry with a clean towel (which should then be washed). Staph lives in skin scales from humans that contaminate the environment. This can lead to re-infection.   Disinfect the shower floor and/or bath tub daily   On days 1, 3, AND 5 of the treatment wash your clothes, underwear, pajamas and bed linen (such as towels, sheets, washcloths, and bath mats). A hot wash with laundry detergent is best (there is no need to use expensive laundry detergent or powder). Dry clothes in sun if possible. Change into clean clothes or pajamas on those days after your shower.   Do not share or exchange any personal items of clothing  Sports/Gym     Surfaces, equipment and towels, and skin-to-skin contact are all potential sources for staph re-infection Pets Dogs and other companion animals can also be colonized with the same strains of MRSA. Best to wash or replace bedding material for the animal and wash the animal at least once during the treatment period with antiseptic solution (2% chlorhexidine wash). Ensure that the skin of the animal is kept in good condition. If the pet has any chronic skin disorders, consult your vet prior to starting your treatment process. What about my partner, family, or household members? Usually when an aggressive strain of staph moves in to  a family or household, only certain members of that group get infections (boils). This is despite the fact that the strain has probably transferred itself from person to person within the group. Those without boils may also be carrying the bacterium, however they must have better resistance (immunity) or perhaps have better skin condition. Staph likes to invade through cuts, scratches and skin with dermatitis or dryness.    Follow-up after decolonization treatment  Possible approaches will vary depending on how your treatment goes. Your provider will instruct you on the follow-up best suited for you. Some options are:  Wait and see - if no further boils occur within 6 months then it is probable that the strain of staph has been eliminated from you.     Continue intermittent body washes 1-2 times per week with 2% aqueous chlorhexidine soap preparation or similar   Future antibiotic use  The best preventative approach to avoiding future problems may be to avoid use of antibiotics unless there is a strong indication. Antibiotics are often prescribed for minor infections or for respiratory infections that are mostly due to viruses. It is in your best interest to ride these infections out rather than taking antibiotics. Taking antibiotics alters your natural bacterial flora on your skin and in your gut. This may reduce your resistance against acquiring a resistant bacterial strain such as MRSA).   Important note: if you do become very ill with possible infection and require hospital review, it is important for you to remind your medical care providers that you have been colonized with MRSA in the past as they may have to use antibiotics that are active against the strain that you had previously.   References Wiese-Posselt et al. Clin Inf Dis 2007:44:e88  CDC:  http://www.cdc.gov/ncidod/dhqp/ar_mrsa_ca.html   Bleach baths  Take a bleach baths twice a week for at least 15 minutes with any kind of soap for 3  weeks. Bleach baths can be a good treatment for people who do not have broken skin or eczema. They can help prevent MRSA from coming back. Use lotion after the bath, because a bleach bath can dry out the skin.    How much bleach to put in: an average full bathtub (adult level) holds about 40 gallons of water, add 1/4 cup bleach for each 1/4 fill of the bathtub.    

## 2019-03-23 NOTE — Progress Notes (Signed)
[facility]      Reason for Consult: recurrent MRSA skin infections    Referring Physician: Dr. Drema Dallas    Patient ID: Jordan Freud., male    DOB: 12-20-1933, 85 y.o.   MRN: [id number]  HPI:   He gives a history of having multiple skin infections, particularly of his right hand and wrist as well as on his legs.  He more recently has had one on his right leg on the inner thigh that he is currently treating now.  He equates a history of positive MRSA note nasal swab positive after fourth orthopedic surgery in 2018 and has had infections since that time.  They start typically as small areas of redness and he was at one point given triamcinolone to treat these.  More recently he had his current lesion and noted pus developing and was given oral anti-MRSA medications.  He is looking for a way to stop these infections.  He has no associated fever or chills.  He also has some areas particularly on his legs of dry scaly patches. Previous record reviewed from his PCP and positive MRSA culture.   Past Medical History:  Diagnosis Date  . Anemia   . Ankylosing spondylitis ([place])   . Arthritis 1974  . Barrett's esophagus 2004  . BPH (benign prostatic hyperplasia)   . Complication of anesthesia    LOWER EXTREMITY SLOW TO AWAKEN / STIFFNESS IN NECK!  . Diverticulosis   . DJD (degenerative joint disease)    CERVICAL  . GERD (gastroesophageal reflux disease)   . Hiatal hernia   . Hyperlipidemia   . Hypertension   . Nose abrasion    PRE CANCEROUS AREA LEFT SIDE OF NOSE, ABRASION RIGHT LOWER LEG HEALING  . Sleep disturbance     Prior to Admission medications   Medication Sig Start Date End Date Taking? Authorizing Provider  amLODipine (NORVASC) 2.5 MG tablet Take 2.5 mg by mouth daily.    [provider]  chlorhexidine (HIBICLENS) 4 % external liquid Apply topically daily. For 5 days 03/23/19   Thayer Headings, MD  losartan (COZAAR) 100 MG tablet Take 100 mg  by mouth daily.    [provider]  Multiple Vitamin (MULTIVITAMIN) tablet Take 1 tablet by mouth daily.    [provider]  mupirocin ointment (BACTROBAN) 2 % Place 1 application into the nose 2 (two) times daily. For 5 days 03/23/19   Thayer Headings, MD  naproxen sodium (ANAPROX) 220 MG tablet Take 440 mg by mouth 2 (two) times daily with a meal. Take 2 pills in am and 2 pills in pm     [provider]  omeprazole (PRILOSEC) 40 MG capsule Take 40 mg by mouth 2 (two) times daily.    [provider]  tamsulosin (FLOMAX) 0.4 MG CAPS capsule Take 0.4 mg by mouth daily.    [provider]    No Known Allergies  Social History   Tobacco Use  . Smoking status: Former Smoker    Types: Cigarettes    Last attempt to quit: 11/07/1998    Years since quitting: 20.3  . Smokeless tobacco: Never Used  Substance Use Topics  . Alcohol use: Yes    Comment: RARE/SOCIALLY  . Drug use: No    Family History  Problem Relation Age of Onset  . Hypertension Father   . Dementia Father   . Diabetes Father   . Heart disease Father   .  Hyperlipidemia Father   . Heart attack Father   . Diabetes Brother   . Depression Brother   . Ovarian cancer Sister   . Diabetes Daughter   . Diabetes Brother   . Colon cancer Neg Hx   . Stomach cancer Neg Hx   . Esophageal cancer Neg Hx   . Rectal cancer Neg Hx   . Liver cancer Neg Hx     Review of Systems  Constitutional: negative for fevers, chills and anorexia Gastrointestinal: negative for nausea and diarrhea Musculoskeletal: negative for myalgias and arthralgias All other systems reviewed and are negative    Constitutional: in no apparent distress  Vitals:   03/23/19 0851  BP: (!) 147/71  Pulse: 79  Temp: 98.1 F (36.7 C)   EYES: anicteric ENMT: no thrush Cardiovascular: Cor RRR Respiratory: CTA B; normal respiratory effort Musculoskeletal: no pedal edema noted Skin: + several areas of dry, scaly  patches Neuro: non-focal  Labs: Lab Results  Component Value Date   WBC 13.9 (H) 02/13/2017   HGB 10.7 (L) 02/13/2017   HCT 30.9 (L) 02/13/2017   MCV 95.7 02/13/2017   PLT 428 (H) 02/13/2017    Lab Results  Component Value Date   CREATININE 0.81 02/13/2017   BUN 13 02/13/2017   NA 136 02/13/2017   K 3.8 02/13/2017   CL 100 (L) 02/13/2017   CO2 29 02/13/2017    Lab Results  Component Value Date   ALT 24 02/09/2017   AST 50 (H) 02/09/2017   ALKPHOS 57 02/09/2017   BILITOT 1.5 (H) 02/09/2017   INR 1.06 01/28/2017     Assessment: recurrent MRSA skin infections.  I discussed the natural history of these and trying to break the cycle with decolonization of his skin and nasal passages with mupirocin in his nasal passages and under his fingernails as well as chlorhexidine baths for 5 days.  I also discussed bleach baths and the formula for this though he has trouble getting in and out of a bathtub.  I also discussed his lesions on his legs and they appear more consistent with a fungal areas.  I suspect these have been contributing to his skin infections as he scratches these areas as well.  Plan: 1) continue antifungal cream on fungal areas 2) MRSA decolonization protocol provided. Mupirocin ointment to nasal passages and under fingernail twice a day.  Hibiclens bath daily for 5 days.   3) environmental cleaning including sheets, clothing, counters.    Return as needed.

## 2019-03-30 ENCOUNTER — Telehealth: Payer: Self-pay | Admitting: Behavioral Health

## 2019-03-30 NOTE — Telephone Encounter (Addendum)
Called [place] back at [facility] informed her per Dr. Linus Salmons that he does not recommend repeat nasal swabs.  Swabs are being used for COVID only at this time regardless.  Sharee Pimple verbalized understanding and states she would relay this information to the patient. Pricilla Riffle RN

## 2019-03-30 NOTE — Telephone Encounter (Signed)
Sharee Pimple form [facility] at [employer] called stating patient was referred to [facility] for MRSA decolonization.  She states patient is now calling their office demanding repeat nasal swab to see if the MRSA is gone.  She states the patient completed the 5 day treatment plan recommended by Dr. Linus Salmons.  Sharee Pimple wants to know if  repeat nasal swabs are required or appropriate at this time. Pricilla Riffle RN

## 2019-03-30 NOTE — Telephone Encounter (Signed)
We do not recommend repeat nasal swabs.   Swabs are being used for COVID only at this time regardless.

## 2019-03-31 DIAGNOSIS — M459 Ankylosing spondylitis of unspecified sites in spine: Secondary | ICD-10-CM | POA: Diagnosis not present

## 2019-03-31 DIAGNOSIS — F411 Generalized anxiety disorder: Secondary | ICD-10-CM | POA: Diagnosis not present

## 2019-03-31 DIAGNOSIS — Z8614 Personal history of Methicillin resistant Staphylococcus aureus infection: Secondary | ICD-10-CM | POA: Diagnosis not present

## 2019-04-04 DIAGNOSIS — R0681 Apnea, not elsewhere classified: Secondary | ICD-10-CM | POA: Diagnosis not present

## 2019-04-04 DIAGNOSIS — N401 Enlarged prostate with lower urinary tract symptoms: Secondary | ICD-10-CM | POA: Diagnosis not present

## 2019-04-16 DIAGNOSIS — G4733 Obstructive sleep apnea (adult) (pediatric): Secondary | ICD-10-CM | POA: Diagnosis not present

## 2019-04-18 DIAGNOSIS — G4733 Obstructive sleep apnea (adult) (pediatric): Secondary | ICD-10-CM | POA: Diagnosis not present

## 2019-05-09 DIAGNOSIS — G4733 Obstructive sleep apnea (adult) (pediatric): Secondary | ICD-10-CM | POA: Diagnosis not present

## 2019-05-16 DIAGNOSIS — Z85828 Personal history of other malignant neoplasm of skin: Secondary | ICD-10-CM | POA: Diagnosis not present

## 2019-05-16 DIAGNOSIS — D229 Melanocytic nevi, unspecified: Secondary | ICD-10-CM | POA: Diagnosis not present

## 2019-05-16 DIAGNOSIS — C44329 Squamous cell carcinoma of skin of other parts of face: Secondary | ICD-10-CM | POA: Diagnosis not present

## 2019-05-16 DIAGNOSIS — L309 Dermatitis, unspecified: Secondary | ICD-10-CM | POA: Diagnosis not present

## 2019-05-16 DIAGNOSIS — L819 Disorder of pigmentation, unspecified: Secondary | ICD-10-CM | POA: Diagnosis not present

## 2019-05-16 DIAGNOSIS — D485 Neoplasm of uncertain behavior of skin: Secondary | ICD-10-CM | POA: Diagnosis not present

## 2019-05-16 DIAGNOSIS — L821 Other seborrheic keratosis: Secondary | ICD-10-CM | POA: Diagnosis not present

## 2019-05-16 DIAGNOSIS — L570 Actinic keratosis: Secondary | ICD-10-CM | POA: Diagnosis not present

## 2019-07-05 ENCOUNTER — Encounter: Payer: Self-pay | Admitting: Gastroenterology

## 2019-07-09 DIAGNOSIS — G4733 Obstructive sleep apnea (adult) (pediatric): Secondary | ICD-10-CM | POA: Diagnosis not present

## 2019-07-14 ENCOUNTER — Encounter: Payer: Self-pay | Admitting: Gastroenterology

## 2019-08-01 DIAGNOSIS — G4733 Obstructive sleep apnea (adult) (pediatric): Secondary | ICD-10-CM | POA: Diagnosis not present

## 2019-08-09 DIAGNOSIS — G4733 Obstructive sleep apnea (adult) (pediatric): Secondary | ICD-10-CM | POA: Diagnosis not present

## 2019-09-07 DIAGNOSIS — H5702 Anisocoria: Secondary | ICD-10-CM | POA: Diagnosis not present

## 2019-09-07 DIAGNOSIS — H2511 Age-related nuclear cataract, right eye: Secondary | ICD-10-CM | POA: Diagnosis not present

## 2019-09-07 DIAGNOSIS — H16143 Punctate keratitis, bilateral: Secondary | ICD-10-CM | POA: Diagnosis not present

## 2019-09-07 DIAGNOSIS — H524 Presbyopia: Secondary | ICD-10-CM | POA: Diagnosis not present

## 2019-09-07 DIAGNOSIS — H27112 Subluxation of lens, left eye: Secondary | ICD-10-CM | POA: Diagnosis not present

## 2019-09-07 DIAGNOSIS — H5203 Hypermetropia, bilateral: Secondary | ICD-10-CM | POA: Diagnosis not present

## 2019-09-07 DIAGNOSIS — H21232 Degeneration of iris (pigmentary), left eye: Secondary | ICD-10-CM | POA: Diagnosis not present

## 2019-09-07 DIAGNOSIS — Z961 Presence of intraocular lens: Secondary | ICD-10-CM | POA: Diagnosis not present

## 2019-09-07 DIAGNOSIS — H52203 Unspecified astigmatism, bilateral: Secondary | ICD-10-CM | POA: Diagnosis not present

## 2019-09-08 DIAGNOSIS — G4733 Obstructive sleep apnea (adult) (pediatric): Secondary | ICD-10-CM | POA: Diagnosis not present

## 2019-10-09 DIAGNOSIS — G4733 Obstructive sleep apnea (adult) (pediatric): Secondary | ICD-10-CM | POA: Diagnosis not present

## 2019-10-11 DIAGNOSIS — M459 Ankylosing spondylitis of unspecified sites in spine: Secondary | ICD-10-CM | POA: Diagnosis not present

## 2019-10-11 DIAGNOSIS — E7800 Pure hypercholesterolemia, unspecified: Secondary | ICD-10-CM | POA: Diagnosis not present

## 2019-10-11 DIAGNOSIS — N401 Enlarged prostate with lower urinary tract symptoms: Secondary | ICD-10-CM | POA: Diagnosis not present

## 2019-10-11 DIAGNOSIS — I10 Essential (primary) hypertension: Secondary | ICD-10-CM | POA: Diagnosis not present

## 2019-10-11 DIAGNOSIS — F339 Major depressive disorder, recurrent, unspecified: Secondary | ICD-10-CM | POA: Diagnosis not present

## 2019-10-11 DIAGNOSIS — I2510 Atherosclerotic heart disease of native coronary artery without angina pectoris: Secondary | ICD-10-CM | POA: Diagnosis not present

## 2019-10-11 DIAGNOSIS — E119 Type 2 diabetes mellitus without complications: Secondary | ICD-10-CM | POA: Diagnosis not present

## 2019-10-28 DIAGNOSIS — Z23 Encounter for immunization: Secondary | ICD-10-CM | POA: Diagnosis not present

## 2019-11-08 DIAGNOSIS — G4733 Obstructive sleep apnea (adult) (pediatric): Secondary | ICD-10-CM | POA: Diagnosis not present

## 2019-11-14 DIAGNOSIS — R809 Proteinuria, unspecified: Secondary | ICD-10-CM | POA: Diagnosis not present

## 2019-11-14 DIAGNOSIS — D485 Neoplasm of uncertain behavior of skin: Secondary | ICD-10-CM | POA: Diagnosis not present

## 2019-11-14 DIAGNOSIS — D225 Melanocytic nevi of trunk: Secondary | ICD-10-CM | POA: Diagnosis not present

## 2019-11-14 DIAGNOSIS — F411 Generalized anxiety disorder: Secondary | ICD-10-CM | POA: Diagnosis not present

## 2019-11-14 DIAGNOSIS — I499 Cardiac arrhythmia, unspecified: Secondary | ICD-10-CM | POA: Diagnosis not present

## 2019-11-14 DIAGNOSIS — L820 Inflamed seborrheic keratosis: Secondary | ICD-10-CM | POA: Diagnosis not present

## 2019-11-14 DIAGNOSIS — E119 Type 2 diabetes mellitus without complications: Secondary | ICD-10-CM | POA: Diagnosis not present

## 2019-11-14 DIAGNOSIS — L821 Other seborrheic keratosis: Secondary | ICD-10-CM | POA: Diagnosis not present

## 2019-11-14 DIAGNOSIS — E7800 Pure hypercholesterolemia, unspecified: Secondary | ICD-10-CM | POA: Diagnosis not present

## 2019-11-14 DIAGNOSIS — D649 Anemia, unspecified: Secondary | ICD-10-CM | POA: Diagnosis not present

## 2019-11-14 DIAGNOSIS — I10 Essential (primary) hypertension: Secondary | ICD-10-CM | POA: Diagnosis not present

## 2019-11-14 DIAGNOSIS — Z0000 Encounter for general adult medical examination without abnormal findings: Secondary | ICD-10-CM | POA: Diagnosis not present

## 2019-11-14 DIAGNOSIS — D1801 Hemangioma of skin and subcutaneous tissue: Secondary | ICD-10-CM | POA: Diagnosis not present

## 2019-11-14 DIAGNOSIS — I2510 Atherosclerotic heart disease of native coronary artery without angina pectoris: Secondary | ICD-10-CM | POA: Diagnosis not present

## 2019-11-15 ENCOUNTER — Other Ambulatory Visit: Payer: Self-pay | Admitting: Family Medicine

## 2019-11-15 DIAGNOSIS — I714 Abdominal aortic aneurysm, without rupture, unspecified: Secondary | ICD-10-CM

## 2019-11-29 ENCOUNTER — Ambulatory Visit
Admission: RE | Admit: 2019-11-29 | Discharge: 2019-11-29 | Disposition: A | Discharge status: Home / Self Care | Payer: Medicare HMO | Source: Ambulatory Visit | Attending: Family Medicine | Admitting: Family Medicine

## 2019-11-29 DIAGNOSIS — I714 Abdominal aortic aneurysm, without rupture, unspecified: Secondary | ICD-10-CM

## 2019-12-09 DIAGNOSIS — G4733 Obstructive sleep apnea (adult) (pediatric): Secondary | ICD-10-CM | POA: Diagnosis not present

## 2019-12-14 DIAGNOSIS — I2510 Atherosclerotic heart disease of native coronary artery without angina pectoris: Secondary | ICD-10-CM | POA: Diagnosis not present

## 2019-12-14 DIAGNOSIS — E7800 Pure hypercholesterolemia, unspecified: Secondary | ICD-10-CM | POA: Diagnosis not present

## 2019-12-14 DIAGNOSIS — M459 Ankylosing spondylitis of unspecified sites in spine: Secondary | ICD-10-CM | POA: Diagnosis not present

## 2019-12-14 DIAGNOSIS — N401 Enlarged prostate with lower urinary tract symptoms: Secondary | ICD-10-CM | POA: Diagnosis not present

## 2019-12-14 DIAGNOSIS — F339 Major depressive disorder, recurrent, unspecified: Secondary | ICD-10-CM | POA: Diagnosis not present

## 2019-12-14 DIAGNOSIS — I10 Essential (primary) hypertension: Secondary | ICD-10-CM | POA: Diagnosis not present

## 2019-12-14 DIAGNOSIS — D649 Anemia, unspecified: Secondary | ICD-10-CM | POA: Diagnosis not present

## 2019-12-14 DIAGNOSIS — E119 Type 2 diabetes mellitus without complications: Secondary | ICD-10-CM | POA: Diagnosis not present

## 2019-12-21 DIAGNOSIS — R809 Proteinuria, unspecified: Secondary | ICD-10-CM | POA: Diagnosis not present

## 2020-01-09 DIAGNOSIS — G4733 Obstructive sleep apnea (adult) (pediatric): Secondary | ICD-10-CM | POA: Diagnosis not present

## 2020-01-24 DIAGNOSIS — I10 Essential (primary) hypertension: Secondary | ICD-10-CM | POA: Diagnosis not present

## 2020-01-24 DIAGNOSIS — F339 Major depressive disorder, recurrent, unspecified: Secondary | ICD-10-CM | POA: Diagnosis not present

## 2020-01-24 DIAGNOSIS — E119 Type 2 diabetes mellitus without complications: Secondary | ICD-10-CM | POA: Diagnosis not present

## 2020-01-24 DIAGNOSIS — I2510 Atherosclerotic heart disease of native coronary artery without angina pectoris: Secondary | ICD-10-CM | POA: Diagnosis not present

## 2020-01-24 DIAGNOSIS — E7800 Pure hypercholesterolemia, unspecified: Secondary | ICD-10-CM | POA: Diagnosis not present

## 2020-01-24 DIAGNOSIS — N401 Enlarged prostate with lower urinary tract symptoms: Secondary | ICD-10-CM | POA: Diagnosis not present

## 2020-01-24 DIAGNOSIS — M459 Ankylosing spondylitis of unspecified sites in spine: Secondary | ICD-10-CM | POA: Diagnosis not present

## 2020-01-24 DIAGNOSIS — D649 Anemia, unspecified: Secondary | ICD-10-CM | POA: Diagnosis not present

## 2020-02-06 DIAGNOSIS — G4733 Obstructive sleep apnea (adult) (pediatric): Secondary | ICD-10-CM | POA: Diagnosis not present

## 2020-03-08 DIAGNOSIS — G4733 Obstructive sleep apnea (adult) (pediatric): Secondary | ICD-10-CM | POA: Diagnosis not present

## 2020-04-07 DIAGNOSIS — G4733 Obstructive sleep apnea (adult) (pediatric): Secondary | ICD-10-CM | POA: Diagnosis not present

## 2020-05-08 DIAGNOSIS — G4733 Obstructive sleep apnea (adult) (pediatric): Secondary | ICD-10-CM | POA: Diagnosis not present

## 2020-06-26 DIAGNOSIS — G4733 Obstructive sleep apnea (adult) (pediatric): Secondary | ICD-10-CM | POA: Diagnosis not present

## 2020-07-02 DIAGNOSIS — F339 Major depressive disorder, recurrent, unspecified: Secondary | ICD-10-CM | POA: Diagnosis not present

## 2020-07-02 DIAGNOSIS — I714 Abdominal aortic aneurysm, without rupture: Secondary | ICD-10-CM | POA: Diagnosis not present

## 2020-07-02 DIAGNOSIS — E7800 Pure hypercholesterolemia, unspecified: Secondary | ICD-10-CM | POA: Diagnosis not present

## 2020-07-02 DIAGNOSIS — I2510 Atherosclerotic heart disease of native coronary artery without angina pectoris: Secondary | ICD-10-CM | POA: Diagnosis not present

## 2020-07-02 DIAGNOSIS — Z862 Personal history of diseases of the blood and blood-forming organs and certain disorders involving the immune mechanism: Secondary | ICD-10-CM | POA: Diagnosis not present

## 2020-07-02 DIAGNOSIS — I10 Essential (primary) hypertension: Secondary | ICD-10-CM | POA: Diagnosis not present

## 2020-07-02 DIAGNOSIS — E119 Type 2 diabetes mellitus without complications: Secondary | ICD-10-CM | POA: Diagnosis not present

## 2020-07-02 DIAGNOSIS — F411 Generalized anxiety disorder: Secondary | ICD-10-CM | POA: Diagnosis not present

## 2020-07-11 DIAGNOSIS — M25551 Pain in right hip: Secondary | ICD-10-CM | POA: Diagnosis not present

## 2020-07-11 DIAGNOSIS — M5136 Other intervertebral disc degeneration, lumbar region: Secondary | ICD-10-CM | POA: Diagnosis not present

## 2020-07-11 DIAGNOSIS — M545 Low back pain: Secondary | ICD-10-CM | POA: Diagnosis not present

## 2020-07-11 DIAGNOSIS — M25561 Pain in right knee: Secondary | ICD-10-CM | POA: Diagnosis not present

## 2020-07-11 DIAGNOSIS — M2550 Pain in unspecified joint: Secondary | ICD-10-CM | POA: Diagnosis not present

## 2020-07-11 DIAGNOSIS — M25562 Pain in left knee: Secondary | ICD-10-CM | POA: Diagnosis not present

## 2020-07-16 DIAGNOSIS — M545 Low back pain: Secondary | ICD-10-CM | POA: Diagnosis not present

## 2020-07-16 DIAGNOSIS — R262 Difficulty in walking, not elsewhere classified: Secondary | ICD-10-CM | POA: Diagnosis not present

## 2020-07-20 DIAGNOSIS — R262 Difficulty in walking, not elsewhere classified: Secondary | ICD-10-CM | POA: Diagnosis not present

## 2020-07-20 DIAGNOSIS — M545 Low back pain: Secondary | ICD-10-CM | POA: Diagnosis not present

## 2020-07-25 DIAGNOSIS — M545 Low back pain: Secondary | ICD-10-CM | POA: Diagnosis not present

## 2020-07-25 DIAGNOSIS — R262 Difficulty in walking, not elsewhere classified: Secondary | ICD-10-CM | POA: Diagnosis not present

## 2020-08-02 DIAGNOSIS — D649 Anemia, unspecified: Secondary | ICD-10-CM | POA: Diagnosis not present

## 2020-08-02 DIAGNOSIS — F339 Major depressive disorder, recurrent, unspecified: Secondary | ICD-10-CM | POA: Diagnosis not present

## 2020-08-02 DIAGNOSIS — I10 Essential (primary) hypertension: Secondary | ICD-10-CM | POA: Diagnosis not present

## 2020-08-02 DIAGNOSIS — M459 Ankylosing spondylitis of unspecified sites in spine: Secondary | ICD-10-CM | POA: Diagnosis not present

## 2020-08-02 DIAGNOSIS — N401 Enlarged prostate with lower urinary tract symptoms: Secondary | ICD-10-CM | POA: Diagnosis not present

## 2020-08-02 DIAGNOSIS — I2510 Atherosclerotic heart disease of native coronary artery without angina pectoris: Secondary | ICD-10-CM | POA: Diagnosis not present

## 2020-08-02 DIAGNOSIS — E7800 Pure hypercholesterolemia, unspecified: Secondary | ICD-10-CM | POA: Diagnosis not present

## 2020-08-02 DIAGNOSIS — E119 Type 2 diabetes mellitus without complications: Secondary | ICD-10-CM | POA: Diagnosis not present

## 2020-10-01 DIAGNOSIS — I10 Essential (primary) hypertension: Secondary | ICD-10-CM | POA: Diagnosis not present

## 2020-10-01 DIAGNOSIS — E7800 Pure hypercholesterolemia, unspecified: Secondary | ICD-10-CM | POA: Diagnosis not present

## 2020-10-01 DIAGNOSIS — E119 Type 2 diabetes mellitus without complications: Secondary | ICD-10-CM | POA: Diagnosis not present

## 2020-10-01 DIAGNOSIS — F339 Major depressive disorder, recurrent, unspecified: Secondary | ICD-10-CM | POA: Diagnosis not present

## 2020-10-15 DIAGNOSIS — E7800 Pure hypercholesterolemia, unspecified: Secondary | ICD-10-CM | POA: Diagnosis not present

## 2020-10-15 DIAGNOSIS — F339 Major depressive disorder, recurrent, unspecified: Secondary | ICD-10-CM | POA: Diagnosis not present

## 2020-10-15 DIAGNOSIS — N401 Enlarged prostate with lower urinary tract symptoms: Secondary | ICD-10-CM | POA: Diagnosis not present

## 2020-10-15 DIAGNOSIS — E119 Type 2 diabetes mellitus without complications: Secondary | ICD-10-CM | POA: Diagnosis not present

## 2020-10-15 DIAGNOSIS — D649 Anemia, unspecified: Secondary | ICD-10-CM | POA: Diagnosis not present

## 2020-10-15 DIAGNOSIS — K219 Gastro-esophageal reflux disease without esophagitis: Secondary | ICD-10-CM | POA: Diagnosis not present

## 2020-10-15 DIAGNOSIS — I2510 Atherosclerotic heart disease of native coronary artery without angina pectoris: Secondary | ICD-10-CM | POA: Diagnosis not present

## 2020-10-15 DIAGNOSIS — I10 Essential (primary) hypertension: Secondary | ICD-10-CM | POA: Diagnosis not present

## 2020-10-15 DIAGNOSIS — M459 Ankylosing spondylitis of unspecified sites in spine: Secondary | ICD-10-CM | POA: Diagnosis not present

## 2020-11-19 DIAGNOSIS — M170 Bilateral primary osteoarthritis of knee: Secondary | ICD-10-CM | POA: Diagnosis not present

## 2020-11-28 DIAGNOSIS — Z0000 Encounter for general adult medical examination without abnormal findings: Secondary | ICD-10-CM | POA: Diagnosis not present

## 2020-11-28 DIAGNOSIS — Z1389 Encounter for screening for other disorder: Secondary | ICD-10-CM | POA: Diagnosis not present

## 2020-12-12 DIAGNOSIS — I2510 Atherosclerotic heart disease of native coronary artery without angina pectoris: Secondary | ICD-10-CM | POA: Diagnosis not present

## 2020-12-12 DIAGNOSIS — N401 Enlarged prostate with lower urinary tract symptoms: Secondary | ICD-10-CM | POA: Diagnosis not present

## 2020-12-12 DIAGNOSIS — M459 Ankylosing spondylitis of unspecified sites in spine: Secondary | ICD-10-CM | POA: Diagnosis not present

## 2020-12-12 DIAGNOSIS — K219 Gastro-esophageal reflux disease without esophagitis: Secondary | ICD-10-CM | POA: Diagnosis not present

## 2020-12-12 DIAGNOSIS — F339 Major depressive disorder, recurrent, unspecified: Secondary | ICD-10-CM | POA: Diagnosis not present

## 2020-12-12 DIAGNOSIS — D649 Anemia, unspecified: Secondary | ICD-10-CM | POA: Diagnosis not present

## 2020-12-12 DIAGNOSIS — E7800 Pure hypercholesterolemia, unspecified: Secondary | ICD-10-CM | POA: Diagnosis not present

## 2020-12-12 DIAGNOSIS — E119 Type 2 diabetes mellitus without complications: Secondary | ICD-10-CM | POA: Diagnosis not present

## 2020-12-12 DIAGNOSIS — I10 Essential (primary) hypertension: Secondary | ICD-10-CM | POA: Diagnosis not present

## 2021-01-14 DIAGNOSIS — G4733 Obstructive sleep apnea (adult) (pediatric): Secondary | ICD-10-CM | POA: Diagnosis not present

## 2021-02-14 DIAGNOSIS — Z85828 Personal history of other malignant neoplasm of skin: Secondary | ICD-10-CM | POA: Diagnosis not present

## 2021-02-14 DIAGNOSIS — D229 Melanocytic nevi, unspecified: Secondary | ICD-10-CM | POA: Diagnosis not present

## 2021-02-14 DIAGNOSIS — L570 Actinic keratosis: Secondary | ICD-10-CM | POA: Diagnosis not present

## 2021-02-14 DIAGNOSIS — L578 Other skin changes due to chronic exposure to nonionizing radiation: Secondary | ICD-10-CM | POA: Diagnosis not present

## 2021-02-14 DIAGNOSIS — L821 Other seborrheic keratosis: Secondary | ICD-10-CM | POA: Diagnosis not present

## 2021-04-15 DIAGNOSIS — G4733 Obstructive sleep apnea (adult) (pediatric): Secondary | ICD-10-CM | POA: Diagnosis not present

## 2021-04-23 DIAGNOSIS — F339 Major depressive disorder, recurrent, unspecified: Secondary | ICD-10-CM | POA: Diagnosis not present

## 2021-04-23 DIAGNOSIS — K219 Gastro-esophageal reflux disease without esophagitis: Secondary | ICD-10-CM | POA: Diagnosis not present

## 2021-04-23 DIAGNOSIS — I2510 Atherosclerotic heart disease of native coronary artery without angina pectoris: Secondary | ICD-10-CM | POA: Diagnosis not present

## 2021-04-23 DIAGNOSIS — N401 Enlarged prostate with lower urinary tract symptoms: Secondary | ICD-10-CM | POA: Diagnosis not present

## 2021-04-23 DIAGNOSIS — E7800 Pure hypercholesterolemia, unspecified: Secondary | ICD-10-CM | POA: Diagnosis not present

## 2021-04-23 DIAGNOSIS — D649 Anemia, unspecified: Secondary | ICD-10-CM | POA: Diagnosis not present

## 2021-04-23 DIAGNOSIS — I10 Essential (primary) hypertension: Secondary | ICD-10-CM | POA: Diagnosis not present

## 2021-04-23 DIAGNOSIS — M459 Ankylosing spondylitis of unspecified sites in spine: Secondary | ICD-10-CM | POA: Diagnosis not present

## 2021-05-28 DIAGNOSIS — H0102B Squamous blepharitis left eye, upper and lower eyelids: Secondary | ICD-10-CM | POA: Diagnosis not present

## 2021-05-28 DIAGNOSIS — H43813 Vitreous degeneration, bilateral: Secondary | ICD-10-CM | POA: Diagnosis not present

## 2021-05-28 DIAGNOSIS — H0102A Squamous blepharitis right eye, upper and lower eyelids: Secondary | ICD-10-CM | POA: Diagnosis not present

## 2021-05-28 DIAGNOSIS — H20022 Recurrent acute iridocyclitis, left eye: Secondary | ICD-10-CM | POA: Diagnosis not present

## 2021-05-28 DIAGNOSIS — Z961 Presence of intraocular lens: Secondary | ICD-10-CM | POA: Diagnosis not present

## 2021-05-28 DIAGNOSIS — H2511 Age-related nuclear cataract, right eye: Secondary | ICD-10-CM | POA: Diagnosis not present

## 2021-05-30 DIAGNOSIS — K219 Gastro-esophageal reflux disease without esophagitis: Secondary | ICD-10-CM | POA: Diagnosis not present

## 2021-05-30 DIAGNOSIS — Z125 Encounter for screening for malignant neoplasm of prostate: Secondary | ICD-10-CM | POA: Diagnosis not present

## 2021-05-30 DIAGNOSIS — R809 Proteinuria, unspecified: Secondary | ICD-10-CM | POA: Diagnosis not present

## 2021-05-30 DIAGNOSIS — F411 Generalized anxiety disorder: Secondary | ICD-10-CM | POA: Diagnosis not present

## 2021-05-30 DIAGNOSIS — D649 Anemia, unspecified: Secondary | ICD-10-CM | POA: Diagnosis not present

## 2021-05-30 DIAGNOSIS — E1169 Type 2 diabetes mellitus with other specified complication: Secondary | ICD-10-CM | POA: Diagnosis not present

## 2021-05-30 DIAGNOSIS — I10 Essential (primary) hypertension: Secondary | ICD-10-CM | POA: Diagnosis not present

## 2021-05-30 DIAGNOSIS — F339 Major depressive disorder, recurrent, unspecified: Secondary | ICD-10-CM | POA: Diagnosis not present

## 2021-05-30 DIAGNOSIS — I2510 Atherosclerotic heart disease of native coronary artery without angina pectoris: Secondary | ICD-10-CM | POA: Diagnosis not present

## 2021-05-30 DIAGNOSIS — E7800 Pure hypercholesterolemia, unspecified: Secondary | ICD-10-CM | POA: Diagnosis not present

## 2021-10-23 DIAGNOSIS — K219 Gastro-esophageal reflux disease without esophagitis: Secondary | ICD-10-CM | POA: Diagnosis not present

## 2021-10-23 DIAGNOSIS — I2510 Atherosclerotic heart disease of native coronary artery without angina pectoris: Secondary | ICD-10-CM | POA: Diagnosis not present

## 2021-10-23 DIAGNOSIS — I10 Essential (primary) hypertension: Secondary | ICD-10-CM | POA: Diagnosis not present

## 2021-10-23 DIAGNOSIS — E1169 Type 2 diabetes mellitus with other specified complication: Secondary | ICD-10-CM | POA: Diagnosis not present

## 2021-10-23 DIAGNOSIS — D649 Anemia, unspecified: Secondary | ICD-10-CM | POA: Diagnosis not present

## 2021-10-23 DIAGNOSIS — E7800 Pure hypercholesterolemia, unspecified: Secondary | ICD-10-CM | POA: Diagnosis not present

## 2021-11-06 DIAGNOSIS — R350 Frequency of micturition: Secondary | ICD-10-CM | POA: Diagnosis not present

## 2021-11-19 DIAGNOSIS — E1169 Type 2 diabetes mellitus with other specified complication: Secondary | ICD-10-CM | POA: Diagnosis not present

## 2021-11-19 DIAGNOSIS — I10 Essential (primary) hypertension: Secondary | ICD-10-CM | POA: Diagnosis not present

## 2021-11-22 DIAGNOSIS — I10 Essential (primary) hypertension: Secondary | ICD-10-CM | POA: Diagnosis not present

## 2021-11-22 DIAGNOSIS — E7800 Pure hypercholesterolemia, unspecified: Secondary | ICD-10-CM | POA: Diagnosis not present

## 2021-11-22 DIAGNOSIS — E785 Hyperlipidemia, unspecified: Secondary | ICD-10-CM | POA: Diagnosis not present

## 2021-11-22 DIAGNOSIS — D649 Anemia, unspecified: Secondary | ICD-10-CM | POA: Diagnosis not present

## 2021-11-22 DIAGNOSIS — E1169 Type 2 diabetes mellitus with other specified complication: Secondary | ICD-10-CM | POA: Diagnosis not present

## 2021-11-22 DIAGNOSIS — K219 Gastro-esophageal reflux disease without esophagitis: Secondary | ICD-10-CM | POA: Diagnosis not present

## 2021-11-22 DIAGNOSIS — I2510 Atherosclerotic heart disease of native coronary artery without angina pectoris: Secondary | ICD-10-CM | POA: Diagnosis not present

## 2021-12-12 DIAGNOSIS — E1169 Type 2 diabetes mellitus with other specified complication: Secondary | ICD-10-CM | POA: Diagnosis not present

## 2021-12-12 DIAGNOSIS — I10 Essential (primary) hypertension: Secondary | ICD-10-CM | POA: Diagnosis not present

## 2021-12-12 DIAGNOSIS — E7800 Pure hypercholesterolemia, unspecified: Secondary | ICD-10-CM | POA: Diagnosis not present

## 2021-12-17 DIAGNOSIS — Z1389 Encounter for screening for other disorder: Secondary | ICD-10-CM | POA: Diagnosis not present

## 2021-12-17 DIAGNOSIS — Z0000 Encounter for general adult medical examination without abnormal findings: Secondary | ICD-10-CM | POA: Diagnosis not present

## 2022-01-23 DIAGNOSIS — E1169 Type 2 diabetes mellitus with other specified complication: Secondary | ICD-10-CM | POA: Diagnosis not present

## 2022-01-23 DIAGNOSIS — I10 Essential (primary) hypertension: Secondary | ICD-10-CM | POA: Diagnosis not present

## 2022-01-23 DIAGNOSIS — E7800 Pure hypercholesterolemia, unspecified: Secondary | ICD-10-CM | POA: Diagnosis not present

## 2022-01-23 DIAGNOSIS — F339 Major depressive disorder, recurrent, unspecified: Secondary | ICD-10-CM | POA: Diagnosis not present

## 2022-03-31 DIAGNOSIS — R062 Wheezing: Secondary | ICD-10-CM | POA: Diagnosis not present

## 2022-03-31 DIAGNOSIS — R059 Cough, unspecified: Secondary | ICD-10-CM | POA: Diagnosis not present

## 2022-03-31 DIAGNOSIS — R0602 Shortness of breath: Secondary | ICD-10-CM | POA: Diagnosis not present

## 2022-03-31 DIAGNOSIS — J40 Bronchitis, not specified as acute or chronic: Secondary | ICD-10-CM | POA: Diagnosis not present

## 2022-03-31 DIAGNOSIS — R0989 Other specified symptoms and signs involving the circulatory and respiratory systems: Secondary | ICD-10-CM | POA: Diagnosis not present

## 2022-03-31 DIAGNOSIS — Z20822 Contact with and (suspected) exposure to covid-19: Secondary | ICD-10-CM | POA: Diagnosis not present

## 2022-03-31 DIAGNOSIS — R053 Chronic cough: Secondary | ICD-10-CM | POA: Diagnosis not present

## 2022-04-08 DIAGNOSIS — R059 Cough, unspecified: Secondary | ICD-10-CM | POA: Diagnosis not present

## 2022-04-08 DIAGNOSIS — J22 Unspecified acute lower respiratory infection: Secondary | ICD-10-CM | POA: Diagnosis not present

## 2022-06-20 DIAGNOSIS — I10 Essential (primary) hypertension: Secondary | ICD-10-CM | POA: Diagnosis not present

## 2022-06-20 DIAGNOSIS — Z6833 Body mass index (BMI) 33.0-33.9, adult: Secondary | ICD-10-CM | POA: Diagnosis not present

## 2022-06-20 DIAGNOSIS — M159 Polyosteoarthritis, unspecified: Secondary | ICD-10-CM | POA: Diagnosis not present

## 2022-06-20 DIAGNOSIS — K219 Gastro-esophageal reflux disease without esophagitis: Secondary | ICD-10-CM | POA: Diagnosis not present

## 2022-06-20 DIAGNOSIS — N401 Enlarged prostate with lower urinary tract symptoms: Secondary | ICD-10-CM | POA: Diagnosis not present

## 2022-06-20 DIAGNOSIS — M25561 Pain in right knee: Secondary | ICD-10-CM | POA: Diagnosis not present

## 2022-06-20 DIAGNOSIS — I25119 Atherosclerotic heart disease of native coronary artery with unspecified angina pectoris: Secondary | ICD-10-CM | POA: Diagnosis not present

## 2022-06-20 DIAGNOSIS — E669 Obesity, unspecified: Secondary | ICD-10-CM | POA: Diagnosis not present

## 2022-06-20 DIAGNOSIS — Z0000 Encounter for general adult medical examination without abnormal findings: Secondary | ICD-10-CM | POA: Diagnosis not present

## 2022-06-20 DIAGNOSIS — E785 Hyperlipidemia, unspecified: Secondary | ICD-10-CM | POA: Diagnosis not present

## 2022-06-20 DIAGNOSIS — I714 Abdominal aortic aneurysm, without rupture, unspecified: Secondary | ICD-10-CM | POA: Diagnosis not present

## 2022-06-20 DIAGNOSIS — E1165 Type 2 diabetes mellitus with hyperglycemia: Secondary | ICD-10-CM | POA: Diagnosis not present

## 2022-07-02 DIAGNOSIS — R351 Nocturia: Secondary | ICD-10-CM | POA: Diagnosis not present

## 2022-07-02 DIAGNOSIS — N401 Enlarged prostate with lower urinary tract symptoms: Secondary | ICD-10-CM | POA: Diagnosis not present

## 2022-07-18 DIAGNOSIS — R0602 Shortness of breath: Secondary | ICD-10-CM | POA: Diagnosis not present

## 2022-07-18 DIAGNOSIS — M7989 Other specified soft tissue disorders: Secondary | ICD-10-CM | POA: Diagnosis not present

## 2022-07-18 DIAGNOSIS — I714 Abdominal aortic aneurysm, without rupture, unspecified: Secondary | ICD-10-CM | POA: Diagnosis not present

## 2022-07-18 DIAGNOSIS — I517 Cardiomegaly: Secondary | ICD-10-CM | POA: Diagnosis not present

## 2022-08-08 DIAGNOSIS — I071 Rheumatic tricuspid insufficiency: Secondary | ICD-10-CM | POA: Diagnosis not present

## 2022-08-21 DIAGNOSIS — R0602 Shortness of breath: Secondary | ICD-10-CM | POA: Diagnosis not present

## 2022-08-25 DIAGNOSIS — M170 Bilateral primary osteoarthritis of knee: Secondary | ICD-10-CM | POA: Diagnosis not present

## 2022-09-08 DIAGNOSIS — M7989 Other specified soft tissue disorders: Secondary | ICD-10-CM | POA: Diagnosis not present

## 2022-09-18 DIAGNOSIS — R600 Localized edema: Secondary | ICD-10-CM | POA: Diagnosis not present

## 2022-09-18 DIAGNOSIS — E785 Hyperlipidemia, unspecified: Secondary | ICD-10-CM | POA: Diagnosis not present

## 2022-09-18 DIAGNOSIS — I2510 Atherosclerotic heart disease of native coronary artery without angina pectoris: Secondary | ICD-10-CM | POA: Diagnosis not present

## 2022-09-18 DIAGNOSIS — I10 Essential (primary) hypertension: Secondary | ICD-10-CM | POA: Diagnosis not present

## 2022-09-18 DIAGNOSIS — E1169 Type 2 diabetes mellitus with other specified complication: Secondary | ICD-10-CM | POA: Diagnosis not present

## 2022-09-23 DIAGNOSIS — F3341 Major depressive disorder, recurrent, in partial remission: Secondary | ICD-10-CM | POA: Diagnosis not present

## 2022-09-23 DIAGNOSIS — E1169 Type 2 diabetes mellitus with other specified complication: Secondary | ICD-10-CM | POA: Diagnosis not present

## 2022-09-23 DIAGNOSIS — N401 Enlarged prostate with lower urinary tract symptoms: Secondary | ICD-10-CM | POA: Diagnosis not present

## 2022-09-23 DIAGNOSIS — R918 Other nonspecific abnormal finding of lung field: Secondary | ICD-10-CM | POA: Diagnosis not present

## 2022-09-23 DIAGNOSIS — R053 Chronic cough: Secondary | ICD-10-CM | POA: Diagnosis not present

## 2022-09-23 DIAGNOSIS — I10 Essential (primary) hypertension: Secondary | ICD-10-CM | POA: Diagnosis not present

## 2022-09-23 DIAGNOSIS — R9389 Abnormal findings on diagnostic imaging of other specified body structures: Secondary | ICD-10-CM | POA: Diagnosis not present

## 2022-09-23 DIAGNOSIS — R351 Nocturia: Secondary | ICD-10-CM | POA: Diagnosis not present

## 2022-09-23 DIAGNOSIS — E1165 Type 2 diabetes mellitus with hyperglycemia: Secondary | ICD-10-CM | POA: Diagnosis not present

## 2022-09-23 DIAGNOSIS — K219 Gastro-esophageal reflux disease without esophagitis: Secondary | ICD-10-CM | POA: Diagnosis not present

## 2022-10-09 DIAGNOSIS — I10 Essential (primary) hypertension: Secondary | ICD-10-CM | POA: Diagnosis not present

## 2022-10-09 DIAGNOSIS — F3341 Major depressive disorder, recurrent, in partial remission: Secondary | ICD-10-CM | POA: Diagnosis not present

## 2022-10-09 DIAGNOSIS — G478 Other sleep disorders: Secondary | ICD-10-CM | POA: Diagnosis not present

## 2022-10-09 DIAGNOSIS — R351 Nocturia: Secondary | ICD-10-CM | POA: Diagnosis not present

## 2022-10-09 DIAGNOSIS — G4719 Other hypersomnia: Secondary | ICD-10-CM | POA: Diagnosis not present

## 2022-10-09 DIAGNOSIS — G4733 Obstructive sleep apnea (adult) (pediatric): Secondary | ICD-10-CM | POA: Diagnosis not present

## 2022-10-14 DIAGNOSIS — I517 Cardiomegaly: Secondary | ICD-10-CM | POA: Diagnosis not present

## 2022-10-14 DIAGNOSIS — R9389 Abnormal findings on diagnostic imaging of other specified body structures: Secondary | ICD-10-CM | POA: Diagnosis not present

## 2022-10-14 DIAGNOSIS — R911 Solitary pulmonary nodule: Secondary | ICD-10-CM | POA: Diagnosis not present

## 2022-10-15 DIAGNOSIS — R351 Nocturia: Secondary | ICD-10-CM | POA: Diagnosis not present

## 2022-10-15 DIAGNOSIS — N401 Enlarged prostate with lower urinary tract symptoms: Secondary | ICD-10-CM | POA: Diagnosis not present
